# Patient Record
Sex: Female | Born: 1955 | Race: Black or African American | Hispanic: No | Smoking: Former smoker
Health system: Southern US, Community
[De-identification: ages and names within clinical notes are randomized; demographics above are authoritative.]

## PROBLEM LIST (undated history)

## (undated) DIAGNOSIS — I10 Essential (primary) hypertension: Secondary | ICD-10-CM

## (undated) DIAGNOSIS — E119 Type 2 diabetes mellitus without complications: Secondary | ICD-10-CM

## (undated) DIAGNOSIS — M1990 Unspecified osteoarthritis, unspecified site: Secondary | ICD-10-CM

## (undated) DIAGNOSIS — H269 Unspecified cataract: Secondary | ICD-10-CM

## (undated) DIAGNOSIS — F32A Depression, unspecified: Secondary | ICD-10-CM

## (undated) DIAGNOSIS — K219 Gastro-esophageal reflux disease without esophagitis: Secondary | ICD-10-CM

## (undated) HISTORY — DX: Type 2 diabetes mellitus without complications: E11.9

## (undated) HISTORY — DX: Unspecified cataract: H26.9

## (undated) HISTORY — PX: DILATION AND CURETTAGE, DIAGNOSTIC / THERAPEUTIC: SUR384

## (undated) HISTORY — PX: COLONOSCOPY: SHX174

## (undated) HISTORY — PX: BREAST BIOPSY: SHX20

## (undated) HISTORY — DX: Essential (primary) hypertension: I10

## (undated) HISTORY — PX: UPPER GASTROINTESTINAL ENDOSCOPY: SHX188

## (undated) HISTORY — DX: Depression, unspecified: F32.A

## (undated) HISTORY — DX: Gastro-esophageal reflux disease without esophagitis: K21.9

---

## 2003-02-14 HISTORY — PX: CHOLECYSTECTOMY: SHX55

## 2017-10-10 ENCOUNTER — Other Ambulatory Visit: Payer: Self-pay | Admitting: Family Medicine

## 2017-10-10 DIAGNOSIS — M7989 Other specified soft tissue disorders: Secondary | ICD-10-CM

## 2017-10-11 ENCOUNTER — Ambulatory Visit
Admission: RE | Admit: 2017-10-11 | Discharge: 2017-10-11 | Disposition: A | Discharge status: Home / Self Care | Payer: Federal, State, Local not specified - PPO | Source: Ambulatory Visit | Attending: Family Medicine | Admitting: Family Medicine

## 2017-10-11 DIAGNOSIS — M7989 Other specified soft tissue disorders: Secondary | ICD-10-CM

## 2018-03-28 ENCOUNTER — Ambulatory Visit
Admission: RE | Admit: 2018-03-28 | Discharge: 2018-03-28 | Disposition: A | Discharge status: Home / Self Care | Payer: Federal, State, Local not specified - PPO | Source: Ambulatory Visit | Attending: Family Medicine | Admitting: Family Medicine

## 2018-03-28 ENCOUNTER — Other Ambulatory Visit: Payer: Self-pay | Admitting: Family Medicine

## 2018-03-28 DIAGNOSIS — M25551 Pain in right hip: Secondary | ICD-10-CM

## 2018-05-22 ENCOUNTER — Ambulatory Visit (INDEPENDENT_AMBULATORY_CARE_PROVIDER_SITE_OTHER): Payer: Federal, State, Local not specified - PPO | Admitting: Gastroenterology

## 2018-05-22 ENCOUNTER — Other Ambulatory Visit: Payer: Self-pay

## 2018-05-22 ENCOUNTER — Encounter: Payer: Self-pay | Admitting: Gastroenterology

## 2018-05-22 DIAGNOSIS — E739 Lactose intolerance, unspecified: Secondary | ICD-10-CM | POA: Diagnosis not present

## 2018-05-22 DIAGNOSIS — R197 Diarrhea, unspecified: Secondary | ICD-10-CM | POA: Diagnosis not present

## 2018-05-22 DIAGNOSIS — R1012 Left upper quadrant pain: Secondary | ICD-10-CM | POA: Diagnosis not present

## 2018-05-22 DIAGNOSIS — Z800 Family history of malignant neoplasm of digestive organs: Secondary | ICD-10-CM

## 2018-05-22 NOTE — Progress Notes (Signed)
Deborah Casey    [id number]    [birth year]  Primary Care Physician:Reese, Zadie Cleverly, MD  Referring Physician: Lin Landsman, [place],  [place] [id number]  This service was provided via telemedicine due to [place] 19.  Patient location: Home Provider location: Office Used 2 patient identifiers to confirm the correct person. Explained the limitations in evaluation and management via telemedicine. Patient is aware of potential medical charges for this visit.  Patient consented to this virtual visit (via telephone/webex).  The persons participating in this telemedicine service were myself and the patient    Chief complaint: Diarrhea  HPI: 63 year old female recently moved from [place] with complaints of intermittent diarrhea since December 2019. Her primary care physician prescribed Viberzi but her out-of-pocket expense was higher and she never started it.  Diarrhea intermittent episodes, mostly worse when she eats a large breakfast.  She also recalls having it when she had [facility] with cheese and sour cream.  On most days she has 1 soft bowel movement daily Ibuprofen occasionally but denies frequent NSAID use  Colonoscopy in [place], Sep 2016 with small polyp removal.  Prior to that she had colonoscopy in January 2011 and 2013.  Records are not available during this visit to review She is status post cholecystectomy  Complaining of intermittent left upper abdominal discomfort especially when she lays down on the side worse in the past few weeks. Mother had gastric cancer at age 66  First cousin cousin had breast cancer in her 6's  Sister had cancer in appendix  Never had EGD.  Eyes any dysphagia, odynophagia, melena, bright red blood per rectum, loss of appetite or weight loss     Outpatient Encounter Medications as of 05/22/2018  Medication Sig  . Eluxadoline (VIBERZI) 75 MG TABS Take 1 tablet by mouth once.  Marland Kitchen ibuprofen  (ADVIL,MOTRIN) 800 MG tablet Take 800 mg by mouth every 8 (eight) hours as needed.  Marland Kitchen lisinopril-hydrochlorothiazide (PRINZIDE,ZESTORETIC) 20-12.5 MG tablet Take 1 tablet by mouth daily.  . sitaGLIPtin-metformin (JANUMET) 50-1000 MG tablet Take 1 tablet by mouth 2 (two) times daily with a meal.   No facility-administered encounter medications on file as of 05/22/2018.     Allergies as of 05/22/2018  . (No Known Allergies)    Past Medical History:  Diagnosis Date  . DM (diabetes mellitus), type 2 ([place])   . Hypertension     Past Surgical History:  Procedure Laterality Date  . CHOLECYSTECTOMY  2005  . COLONOSCOPY  02/2009, 1,2013,1,2016    Family History  Problem Relation Age of Onset  . Stomach cancer Mother   . Heart disease Father   . Hypertension Sister   . Hypertension Brother   . Diabetes Brother     Social History   Socioeconomic History  . Marital status: Divorced    Spouse name: Not on file  . Number of children: 2  . Years of education: Not on file  . Highest education level: Not on file  Occupational History  . Occupation: retired  Scientific laboratory technician  . Financial resource strain: Not on file  . Food insecurity:    Worry: Not on file    Inability: Not on file  . Transportation needs:    Medical: Not on file    Non-medical: Not on file  Tobacco Use  . Smoking status: Never Smoker  . Smokeless tobacco: Never Used  Substance and Sexual Activity  . Alcohol use:  Never    Frequency: Never  . Drug use: Never  . Sexual activity: Not on file  Lifestyle  . Physical activity:    Days per week: Not on file    Minutes per session: Not on file  . Stress: Not on file  Relationships  . Social connections:    Talks on phone: Not on file    Gets together: Not on file    Attends religious service: Not on file    Active member of club or organization: Not on file    Attends meetings of clubs or organizations: Not on file    Relationship status: Not on file  . Intimate  partner violence:    Fear of current or ex partner: Not on file    Emotionally abused: Not on file    Physically abused: Not on file    Forced sexual activity: Not on file  Other Topics Concern  . Not on file  Social History Narrative  . Not on file      Review of systems: Review of Systems as per HPI All other systems reviewed and are negative.   Physical Exam: Vitals were not taken and physical exam was not performed during this virtual visit.  Data Reviewed:  Reviewed labs, radiology imaging, old records and pertinent past GI work up   Assessment and Plan/Recommendations:  63 year old female with history of hypertension, diabetes with intermittent diarrhea and left upper quadrant discomfort  Intermittent diarrhea and bloating likely secondary to lactose intolerance We will do a trial of lactose-free diet  Left upper quadrant discomfort and family history of gastric cancer  H. pylori stool antigen  We will schedule for EGD to evaluate, have to delay the procedure for few weeks due to COVID-19 The risks and benefits as well as alternatives of endoscopic procedure(s) have been discussed and reviewed. All questions answered. The patient agrees to proceed.  Due for surveillance colonoscopy 2021 due to family history of colon cancer     K. Denzil Magnuson , MD   CC: Lin Landsman, MD

## 2018-05-22 NOTE — Patient Instructions (Addendum)
Lactose free diet  Use Lact aid tablets (Over the counter) as needed if planning to eat out or any milk/dairy products   Schedule for EGD , will have to delay the procedure for 2 months due to COVID-19  Please send Handout for EGD   H.pylori stool Ag  Thank you for choosing me and [facility].  Dr. Silverio Decamp

## 2018-06-24 ENCOUNTER — Telehealth: Payer: Self-pay | Admitting: *Deleted

## 2018-06-24 DIAGNOSIS — R1012 Left upper quadrant pain: Secondary | ICD-10-CM

## 2018-06-24 DIAGNOSIS — Z800 Family history of malignant neoplasm of digestive organs: Secondary | ICD-10-CM

## 2018-06-24 NOTE — Telephone Encounter (Signed)
Called patient to schedule her EGD for the 19th but patient said she was suppose to have Hpylori test done first,  So patient will come to the lab tomorrow to get test kit and turn back in. I told her we would schedule EGD after we receive her results    Dr Darleen Crocker

## 2018-06-24 NOTE — Telephone Encounter (Signed)
Called patient and told her she did not need to come pick up H pylori testing kit per Dr Jillyn Hidden recommendations   Patient agreed to proceed with the EGD on 5/19  Will mail out instructions today for patient also sent out new activation code for My Chart for patient   Patient aware that care partner has to stay outside

## 2018-06-24 NOTE — Telephone Encounter (Signed)
If she hasnt done the stool test yet, will can cancel it. I will take biopsies during EGD and can proceed with EGD directly if patient is able to do it, if not proceed with H.pylori stool Ag test

## 2018-06-30 ENCOUNTER — Telehealth: Payer: Self-pay | Admitting: *Deleted

## 2018-06-30 NOTE — Telephone Encounter (Signed)
Attempted covid screen. Left VM

## 2018-07-01 ENCOUNTER — Telehealth: Payer: Self-pay | Admitting: *Deleted

## 2018-07-01 NOTE — Telephone Encounter (Signed)
Covid-19 travel screening questions  Have you traveled in the last 14 days?no If yes where?  Do you now or have you had a fever in the last 14 days?no  Do you have any respiratory symptoms of shortness of breath or cough now or in the last 14 days?no  Do you have a medical history of Congestive Heart Failure?  Do you have a medical history of lung disease?  Do you have any family members or close contacts with diagnosed or suspected Covid-19?no  Pt aware of care partner policy and will bring a mask with her. SM

## 2018-07-02 ENCOUNTER — Other Ambulatory Visit: Payer: Self-pay

## 2018-07-02 ENCOUNTER — Encounter: Payer: Self-pay | Admitting: Gastroenterology

## 2018-07-02 ENCOUNTER — Encounter: Payer: Federal, State, Local not specified - PPO | Admitting: Gastroenterology

## 2018-07-02 ENCOUNTER — Ambulatory Visit (AMBULATORY_SURGERY_CENTER): Payer: Federal, State, Local not specified - PPO | Admitting: Gastroenterology

## 2018-07-02 VITALS — BP 101/59 | HR 58 | Temp 98.0°F | Resp 11 | Ht 65.0 in

## 2018-07-02 DIAGNOSIS — K2970 Gastritis, unspecified, without bleeding: Secondary | ICD-10-CM

## 2018-07-02 DIAGNOSIS — R1012 Left upper quadrant pain: Secondary | ICD-10-CM

## 2018-07-02 DIAGNOSIS — K3189 Other diseases of stomach and duodenum: Secondary | ICD-10-CM | POA: Diagnosis not present

## 2018-07-02 MED ORDER — SODIUM CHLORIDE 0.9 % IV SOLN: 500.0000 mL | Freq: Once | INTRAVENOUS | Status: DC

## 2018-07-02 NOTE — Patient Instructions (Signed)
Handout given for gastritis.  YOU HAD AN ENDOSCOPIC PROCEDURE TODAY AT [facility]:   Refer to the procedure report that was given to you for any specific questions about what was found during the examination.  If the procedure report does not answer your questions, please call your gastroenterologist to clarify.  If you requested that your care partner not be given the details of your procedure findings, then the procedure report has been included in a sealed envelope for you to review at your convenience later.  YOU SHOULD EXPECT: Some feelings of bloating in the abdomen. Passage of more gas than usual.  Walking can help get rid of the air that was put into your GI tract during the procedure and reduce the bloating. If you had a lower endoscopy (such as a colonoscopy or flexible sigmoidoscopy) you may notice spotting of blood in your stool or on the toilet paper. If you underwent a bowel prep for your procedure, you may not have a normal bowel movement for a few days.  Please Note:  You might notice some irritation and congestion in your nose or some drainage.  This is from the oxygen used during your procedure.  There is no need for concern and it should clear up in a day or so.  SYMPTOMS TO REPORT IMMEDIATELY:    Following upper endoscopy (EGD)  Vomiting of blood or coffee ground material  New chest pain or pain under the shoulder blades  Painful or persistently difficult swallowing  New shortness of breath  Fever of 100F or higher  Black, tarry-looking stools  For urgent or emergent issues, a gastroenterologist can be reached at any hour by calling [phone].   DIET:  We do recommend a small meal at first, but then you may proceed to your regular diet.  Drink plenty of fluids but you should avoid alcoholic beverages for 24 hours.  ACTIVITY:  You should plan to take it easy for the rest of today and you should NOT DRIVE or use heavy machinery until tomorrow  (because of the sedation medicines used during the test).    FOLLOW UP: Our staff will call the number listed on your records 48-72 hours following your procedure to check on you and address any questions or concerns that you may have regarding the information given to you following your procedure. If we do not reach you, we will leave a message.  We will attempt to reach you two times.  During this call, we will ask if you have developed any symptoms of COVID 19. If you develop any symptoms (for example fever, flu-like symptoms, shortness of breath, cough etc.) before then, please call [phone].  If any biopsies were taken you will be contacted by phone or by letter within the next 1-3 weeks.  Please call us at [phone] if you have not heard about the biopsies in 3 weeks.    SIGNATURES/CONFIDENTIALITY: You and/or your care partner have signed paperwork which will be entered into your electronic medical record.  These signatures attest to the fact that that the information above on your After Visit Summary has been reviewed and is understood.  Full responsibility of the confidentiality of this discharge information lies with you and/or your care-partner.

## 2018-07-02 NOTE — Op Note (Signed)
[facility] Patient Name: Deborah Casey Procedure Date: 07/02/2018 10:39 AM MRN: [id number] Endoscopist: Mauri Pole , MD Age: 63 Referring MD:  Date of Birth: 06-14-1955 Gender: Female Account #: [id number] Procedure:                Upper GI endoscopy Indications:              Abdominal pain in the left upper quadrant Medicines:                Monitored Anesthesia Care Procedure:                Pre-Anesthesia Assessment:                           - Prior to the procedure, a History and Physical                            was performed, and patient medications and                            allergies were reviewed. The patient's tolerance of                            previous anesthesia was also reviewed. The risks                            and benefits of the procedure and the sedation                            options and risks were discussed with the patient.                            All questions were answered, and informed consent                            was obtained. Prior Anticoagulants: The patient has                            taken no previous anticoagulant or antiplatelet                            agents. ASA Grade Assessment: II - A patient with                            mild systemic disease. After reviewing the risks                            and benefits, the patient was deemed in                            satisfactory condition to undergo the procedure.                           After obtaining informed consent, the endoscope was  passed under direct vision. Throughout the                            procedure, the patient's blood pressure, pulse, and                            oxygen saturations were monitored continuously. The                            Endoscope was introduced through the mouth, and                            advanced to the second part of duodenum. The upper                            GI  endoscopy was accomplished without difficulty.                            The patient tolerated the procedure well. Scope In: Scope Out: Findings:                 The Z-line was regular and was found 35 cm from the                            incisors.                           Patchy mild inflammation characterized by                            congestion (edema), erosions and erythema was found                            in the gastric antrum, in the prepyloric region of                            the stomach and at the pylorus. Biopsies were taken                            with a cold forceps for Helicobacter pylori testing.                           The examined duodenum was normal. Complications:            No immediate complications. Estimated Blood Loss:     Estimated blood loss was minimal. Impression:               - Z-line regular, 35 cm from the incisors.                           - Gastritis. Biopsied.                           - Normal examined duodenum. Recommendation:           - Patient has a contact number available  for                            emergencies. The signs and symptoms of potential                            delayed complications were discussed with the                            patient. Return to normal activities tomorrow.                            Written discharge instructions were provided to the                            patient.                           - Resume previous diet.                           - Continue present medications.                           - Await pathology results. Mauri Pole, MD 07/02/2018 10:57:38 AM This report has been signed electronically.

## 2018-07-02 NOTE — Progress Notes (Signed)
PT taken to PACU. Monitors in place. VSS. Report given to RN. 

## 2018-07-02 NOTE — Progress Notes (Signed)
Called to room to assist during endoscopic procedure.  Patient ID and intended procedure confirmed with present staff. Received instructions for my participation in the procedure from the performing physician.  

## 2018-07-04 ENCOUNTER — Telehealth: Payer: Self-pay

## 2018-07-04 ENCOUNTER — Telehealth: Payer: Self-pay | Admitting: *Deleted

## 2018-07-04 NOTE — Telephone Encounter (Signed)
1. Have you developed a fever since your procedure? no  2.   Have you had an respiratory symptoms (SOB or cough) since your procedure? no  3.   Have you tested positive for COVID 19 since your procedure no  3.   Have you had any family members/close contacts diagnosed with the COVID 19 since your procedure?  no   If any of these questions are a yes, please inquire if patient has been seen by family doctor and route this note to Joylene John, Therapist, sports.    Follow up Call-  Call back number 07/02/2018  Post procedure Call Back phone  # [phone]  Permission to leave phone message Yes     Patient questions:  Do you have a fever, pain , or abdominal swelling? No. Pain Score  0 *  Have you tolerated food without any problems? Yes.    Have you been able to return to your normal activities? Yes.    Do you have any questions about your discharge instructions: Diet   No. Medications  No. Follow up visit  No.  Do you have questions or concerns about your Care? No.  Actions: * If pain score is 4 or above: No action needed, pain <4.

## 2018-07-04 NOTE — Telephone Encounter (Signed)
  Follow up Call-  Call back number 07/02/2018  Post procedure Call Back phone  # [phone]  Permission to leave phone message Yes     Left message

## 2018-07-09 ENCOUNTER — Encounter: Payer: Self-pay | Admitting: Gastroenterology

## 2018-07-20 ENCOUNTER — Other Ambulatory Visit: Payer: Self-pay | Admitting: Family Medicine

## 2018-07-20 DIAGNOSIS — Z20822 Contact with and (suspected) exposure to covid-19: Secondary | ICD-10-CM

## 2018-07-22 LAB — NOVEL CORONAVIRUS, NAA: SARS-CoV-2, NAA: NOT DETECTED

## 2019-07-25 ENCOUNTER — Encounter: Payer: Self-pay | Admitting: Gastroenterology

## 2019-09-12 ENCOUNTER — Telehealth: Payer: Self-pay | Admitting: Gastroenterology

## 2019-09-12 NOTE — Telephone Encounter (Signed)
Pt is requesting a call back from a nurse in regards to her prep

## 2019-09-12 NOTE — Telephone Encounter (Signed)
Left message on machine to call back  

## 2019-09-23 ENCOUNTER — Other Ambulatory Visit: Payer: Self-pay

## 2019-09-23 ENCOUNTER — Ambulatory Visit (AMBULATORY_SURGERY_CENTER): Payer: Self-pay

## 2019-09-23 VITALS — Ht 63.0 in | Wt 221.6 lb

## 2019-09-23 DIAGNOSIS — Z800 Family history of malignant neoplasm of digestive organs: Secondary | ICD-10-CM

## 2019-09-23 DIAGNOSIS — R1012 Left upper quadrant pain: Secondary | ICD-10-CM

## 2019-09-23 MED ORDER — SUTAB 1479-225-188 MG PO TABS: 12.0000 | ORAL_TABLET | ORAL | 0 refills | Status: DC

## 2019-09-23 NOTE — Progress Notes (Signed)
No allergies to soy or egg Pt is not on blood thinners or diet pills Denies issues with sedation/intubation Denies atrial flutter/fib Denies constipation   Emmi instructions given to pt  Pt is aware of Covid safety and care partner requirements.  

## 2019-10-01 ENCOUNTER — Telehealth: Payer: Self-pay | Admitting: Gastroenterology

## 2019-10-01 NOTE — Telephone Encounter (Signed)
Answered all of patient's prep questions at this time. Pt understands to take BP med morning of procedure but not the Janumet diabetic medication.

## 2019-10-07 ENCOUNTER — Encounter: Payer: Self-pay | Admitting: Gastroenterology

## 2019-10-07 ENCOUNTER — Other Ambulatory Visit: Payer: Self-pay

## 2019-10-07 ENCOUNTER — Ambulatory Visit (AMBULATORY_SURGERY_CENTER): Payer: Medicare (Managed Care) | Admitting: Gastroenterology

## 2019-10-07 VITALS — BP 103/45 | HR 66 | Temp 96.9°F | Resp 16 | Ht 63.0 in | Wt 221.0 lb

## 2019-10-07 DIAGNOSIS — D122 Benign neoplasm of ascending colon: Secondary | ICD-10-CM

## 2019-10-07 DIAGNOSIS — Z86010 Personal history of colonic polyps: Secondary | ICD-10-CM

## 2019-10-07 DIAGNOSIS — Z800 Family history of malignant neoplasm of digestive organs: Secondary | ICD-10-CM | POA: Diagnosis not present

## 2019-10-07 MED ORDER — SODIUM CHLORIDE 0.9 % IV SOLN: 500.0000 mL | Freq: Once | INTRAVENOUS | Status: DC

## 2019-10-07 NOTE — Op Note (Signed)
[facility] Patient Name: Deborah Casey Procedure Date: 10/07/2019 3:19 PM MRN: [id number] Endoscopist: Mauri Pole , MD Age: 64 Referring MD:  Date of Birth: 1955-03-13 Gender: Female Account #: [id number] Procedure:                Colonoscopy Indications:              Screening in patient at increased risk: Family                            history of 1st-degree relative with colorectal                            cancer, High risk colon cancer surveillance:                            Personal history of colonic polyps, High risk colon                            cancer surveillance: Personal history of adenoma                            less than 10 mm in size Medicines:                Monitored Anesthesia Care Procedure:                Pre-Anesthesia Assessment:                           - Prior to the procedure, a History and Physical                            was performed, and patient medications and                            allergies were reviewed. The patient's tolerance of                            previous anesthesia was also reviewed. The risks                            and benefits of the procedure and the sedation                            options and risks were discussed with the patient.                            All questions were answered, and informed consent                            was obtained. Prior Anticoagulants: The patient has                            taken no previous anticoagulant or antiplatelet  agents. ASA Grade Assessment: III - A patient with                            severe systemic disease. After reviewing the risks                            and benefits, the patient was deemed in                            satisfactory condition to undergo the procedure.                           After obtaining informed consent, the colonoscope                            was passed under direct vision.  Throughout the                            procedure, the patient's blood pressure, pulse, and                            oxygen saturations were monitored continuously. The                            Colonoscope was introduced through the anus and                            advanced to the the cecum, identified by                            appendiceal orifice and ileocecal valve. The                            colonoscopy was performed without difficulty. The                            patient tolerated the procedure well. The quality                            of the bowel preparation was excellent. The                            ileocecal valve, appendiceal orifice, and rectum                            were photographed. Scope In: 3:32:38 PM Scope Out: 3:49:14 PM Scope Withdrawal Time: 0 hours 9 minutes 43 seconds  Total Procedure Duration: 0 hours 16 minutes 36 seconds  Findings:                 The perianal and digital rectal examinations were                            normal.  A less than 1 mm polyp was found in the ascending                            colon. The polyp was sessile. The polyp was removed                            with a cold biopsy forceps. Resection and retrieval                            were complete.                           Non-bleeding internal hemorrhoids were found during                            retroflexion. The hemorrhoids were medium-sized. Complications:            No immediate complications. Estimated Blood Loss:     Estimated blood loss was minimal. Impression:               - One less than 1 mm polyp in the ascending colon,                            removed with a cold biopsy forceps. Resected and                            retrieved.                           - Non-bleeding internal hemorrhoids. Recommendation:           - Patient has a contact number available for                            emergencies. The signs  and symptoms of potential                            delayed complications were discussed with the                            patient. Return to normal activities tomorrow.                            Written discharge instructions were provided to the                            patient.                           - Resume previous diet.                           - Continue present medications.                           - Await pathology results.                           -  Repeat colonoscopy in 5 years for surveillance                            based on pathology results. Mauri Pole, MD 10/07/2019 4:06:06 PM This report has been signed electronically.

## 2019-10-07 NOTE — Progress Notes (Signed)
robinol antisialogogue  Lidocaine   buffer 

## 2019-10-07 NOTE — Progress Notes (Signed)
Called to room to assist during endoscopic procedure.  Patient ID and intended procedure confirmed with present staff. Received instructions for my participation in the procedure from the performing physician.  

## 2019-10-07 NOTE — Patient Instructions (Signed)
Handouts given for Polyps and Hemorrhoids.   YOU HAD AN ENDOSCOPIC PROCEDURE TODAY AT [facility]:   Refer to the procedure report that was given to you for any specific questions about what was found during the examination.  If the procedure report does not answer your questions, please call your gastroenterologist to clarify.  If you requested that your care partner not be given the details of your procedure findings, then the procedure report has been included in a sealed envelope for you to review at your convenience later.  YOU SHOULD EXPECT: Some feelings of bloating in the abdomen. Passage of more gas than usual.  Walking can help get rid of the air that was put into your GI tract during the procedure and reduce the bloating. If you had a lower endoscopy (such as a colonoscopy or flexible sigmoidoscopy) you may notice spotting of blood in your stool or on the toilet paper. If you underwent a bowel prep for your procedure, you may not have a normal bowel movement for a few days.  Please Note:  You might notice some irritation and congestion in your nose or some drainage.  This is from the oxygen used during your procedure.  There is no need for concern and it should clear up in a day or so.  SYMPTOMS TO REPORT IMMEDIATELY:   Following lower endoscopy (colonoscopy or flexible sigmoidoscopy):  Excessive amounts of blood in the stool  Significant tenderness or worsening of abdominal pains  Swelling of the abdomen that is new, acute  Fever of 100F or higher  For urgent or emergent issues, a gastroenterologist can be reached at any hour by calling [phone]. Do not use MyChart messaging for urgent concerns.    DIET:  We do recommend a small meal at first, but then you may proceed to your regular diet.  Drink plenty of fluids but you should avoid alcoholic beverages for 24 hours.  ACTIVITY:  You should plan to take it easy for the rest of today and you should NOT DRIVE  or use heavy machinery until tomorrow (because of the sedation medicines used during the test).    FOLLOW UP: Our staff will call the number listed on your records 48-72 hours following your procedure to check on you and address any questions or concerns that you may have regarding the information given to you following your procedure. If we do not reach you, we will leave a message.  We will attempt to reach you two times.  During this call, we will ask if you have developed any symptoms of COVID 19. If you develop any symptoms (ie: fever, flu-like symptoms, shortness of breath, cough etc.) before then, please call [phone].  If you test positive for Covid 19 in the 2 weeks post procedure, please call and report this information to [place].    If any biopsies were taken you will be contacted by phone or by letter within the next 1-3 weeks.  Please call us at [phone] if you have not heard about the biopsies in 3 weeks.    SIGNATURES/CONFIDENTIALITY: You and/or your care partner have signed paperwork which will be entered into your electronic medical record.  These signatures attest to the fact that that the information above on your After Visit Summary has been reviewed and is understood.  Full responsibility of the confidentiality of this discharge information lies with you and/or your care-partner.

## 2019-10-07 NOTE — Progress Notes (Signed)
A and O x3. Report to RN. Tolerated MAC anesthesia well.

## 2019-10-07 NOTE — Progress Notes (Signed)
Pt's states no medical or surgical changes since previsit or office visit. 

## 2019-10-09 NOTE — Telephone Encounter (Signed)
°  Follow up Call-  Call back number 10/07/2019 07/02/2018  Post procedure Call Back phone  # [phone]  Permission to leave phone message Yes Yes     Patient questions:  Do you have a fever, pain , or abdominal swelling? No. Pain Score  0 *  Have you tolerated food without any problems? Yes.    Have you been able to return to your normal activities? Yes.    Do you have any questions about your discharge instructions: Diet   No. Medications  No. Follow up visit  No.  Do you have questions or concerns about your Care? No.  Actions: * If pain score is 4 or above: No action needed, pain <4.  1. Have you developed a fever since your procedure? no  2.   Have you had an respiratory symptoms (SOB or cough) since your procedure? no  3.   Have you tested positive for COVID 19 since your procedure no  4.   Have you had any family members/close contacts diagnosed with the COVID 19 since your procedure?  no   If yes to any of these questions please route to Joylene John, RN and Joella Prince, RN

## 2019-10-10 ENCOUNTER — Encounter: Payer: Self-pay | Admitting: Gastroenterology

## 2020-02-03 ENCOUNTER — Other Ambulatory Visit: Payer: Self-pay | Admitting: Physical Medicine and Rehabilitation

## 2020-02-03 DIAGNOSIS — M1611 Unilateral primary osteoarthritis, right hip: Secondary | ICD-10-CM

## 2020-02-09 ENCOUNTER — Other Ambulatory Visit: Payer: Federal, State, Local not specified - PPO

## 2020-02-12 ENCOUNTER — Ambulatory Visit
Admission: RE | Admit: 2020-02-12 | Discharge: 2020-02-12 | Disposition: A | Discharge status: Home / Self Care | Payer: Federal, State, Local not specified - PPO | Source: Ambulatory Visit | Attending: Physical Medicine and Rehabilitation | Admitting: Physical Medicine and Rehabilitation

## 2020-02-12 ENCOUNTER — Other Ambulatory Visit: Payer: Self-pay

## 2020-02-12 DIAGNOSIS — M1611 Unilateral primary osteoarthritis, right hip: Secondary | ICD-10-CM

## 2020-02-12 MED ORDER — IOPAMIDOL (ISOVUE-M 200) INJECTION 41%
1.0000 mL | Freq: Once | INTRAMUSCULAR | Status: AC
  Administered 2020-02-12: 1 mL via INTRA_ARTICULAR

## 2020-02-12 MED ORDER — METHYLPREDNISOLONE ACETATE 40 MG/ML INJ SUSP (RADIOLOG
120.0000 mg | Freq: Once | INTRAMUSCULAR | Status: AC
  Administered 2020-02-12: 120 mg via INTRA_ARTICULAR

## 2020-08-06 ENCOUNTER — Encounter: Payer: Self-pay | Admitting: Neurology

## 2020-08-31 ENCOUNTER — Other Ambulatory Visit: Payer: Self-pay | Admitting: Orthopedic Surgery

## 2020-08-31 DIAGNOSIS — Z01818 Encounter for other preprocedural examination: Secondary | ICD-10-CM

## 2020-09-10 ENCOUNTER — Ambulatory Visit (INDEPENDENT_AMBULATORY_CARE_PROVIDER_SITE_OTHER): Payer: Medicare (Managed Care) | Admitting: Neurology

## 2020-09-10 ENCOUNTER — Encounter: Payer: Self-pay | Admitting: Neurology

## 2020-09-10 ENCOUNTER — Other Ambulatory Visit: Payer: Self-pay

## 2020-09-10 VITALS — BP 107/71 | HR 86 | Ht 63.0 in | Wt 213.0 lb

## 2020-09-10 DIAGNOSIS — G2581 Restless legs syndrome: Secondary | ICD-10-CM | POA: Diagnosis not present

## 2020-09-10 NOTE — Progress Notes (Signed)
[facility] Clinic Note - Initial Visit   Date: 09/10/20  Quanah Slinger MRN: [id number] DOB: [birth year]   Dear Dr. Ayesha Rumpf:  Thank you for your kind referral of [place] for consultation of feet paresthesias. Although her history is well known to you, please allow [place] to reiterate it for the purpose of our medical record. The patient was accompanied to the clinic by self.   History of Present Illness: Deborah Casey is a 65 y.o. right-handed female with diabetes mellitus, GERD, hypertension, and depression presenting for evaluation of bilateral feet tingling.  Starting in early 2022, she began having intermittent pins and needles in the feet.  She was evaluated at [facility] and had normal NCS/EMG of the legs.  She was started on gabapentin '100mg'$  at bedtime.  Symptoms have significantly improved and occurs about once per week, lasting about a minute.  She continues to have nights of wanting to rub her feet together to get relief.  She does not have to walk.    Out-side paper records, electronic medical record, and images have been reviewed where available and summarized as:  NCS/EMG of the legs 06/01/2020 [facility]:  Normal  Past Medical History:  Diagnosis Date   Cataract    bilateral but not ready for surgery   Depression    DM (diabetes mellitus), type 2 ([place])    GERD (gastroesophageal reflux disease)    Hypertension     Past Surgical History:  Procedure Laterality Date   CHOLECYSTECTOMY  2005   COLONOSCOPY  02/2009, 1,2013,1,2016   DILATION AND CURETTAGE, DIAGNOSTIC / THERAPEUTIC     UPPER GASTROINTESTINAL ENDOSCOPY     2020     Medications:  Outpatient Encounter Medications as of 09/10/2020  Medication Sig   acetaminophen (TYLENOL) 650 MG CR tablet Take 650 mg by mouth every 8 (eight) hours as needed for pain.   ALPRAZolam (XANAX) 0.5 MG tablet alprazolam 0.5 mg tablet   Ascorbic Acid (VITAMIN C) 100 MG CHEW Vitamin C    aspirin 325 MG tablet Take 325 mg by mouth daily.   Cholecalciferol (D3-1000) 25 MCG (1000 UT) tablet Take 1,000 Units by mouth daily.   cyanocobalamin 1000 MCG tablet Take 1,000 mcg by mouth daily.   doxylamine, Sleep, (UNISOM) 25 MG tablet Take 25 mg by mouth at bedtime as needed.   gabapentin (NEURONTIN) 100 MG capsule Take 100 mg by mouth 3 (three) times daily.   lisinopril-hydrochlorothiazide (PRINZIDE,ZESTORETIC) 20-12.5 MG tablet Take 1 tablet by mouth daily.   Omega-3 Fatty Acids (FISH OIL) 1000 MG CAPS Fish Oil   omeprazole (PRILOSEC) 40 MG capsule Take 40 mg by mouth daily.   sitaGLIPtin-metformin (JANUMET) 50-1000 MG tablet Take 1 tablet by mouth 2 (two) times daily at 10 AM and 5 PM. Take 2 tablets in the evening   Eluxadoline (VIBERZI) 75 MG TABS Take 1 tablet by mouth once. (Patient not taking: No sig reported)   glucose blood test strip FreeStyle Lite Strips (Patient not taking: Reported on 09/10/2020)   hydrOXYzine (ATARAX/VISTARIL) 25 MG tablet Take 25 mg by mouth 4 (four) times daily. (Patient not taking: Reported on 09/10/2020)   ibuprofen (ADVIL,MOTRIN) 800 MG tablet Take 800 mg by mouth every 8 (eight) hours as needed. (Patient not taking: No sig reported)   SitaGLIPtin-MetFORMIN HCl (JANUMET XR) 50-500 MG TB24  (Patient not taking: Reported on 09/10/2020)   No facility-administered encounter medications on file as of 09/10/2020.    Allergies:  Allergies  Allergen Reactions   Ciprofloxacin     Family History: Family History  Problem Relation Age of Onset   Stomach cancer Mother 45   Heart disease Father    Tuberculosis Father    Hypertension Sister    Cancer Sister        appendix   Hypertension Brother    Diabetes Brother    Breast cancer Cousin        20's   Colon cancer Neg Hx    Colon polyps Neg Hx    Esophageal cancer Neg Hx    Rectal cancer Neg Hx     Social History: Social History   Tobacco Use   Smoking status: Former   Smokeless tobacco:  Never  Scientific laboratory technician Use: Never used  Substance Use Topics   Alcohol use: Yes    Comment: rarely   Drug use: Never   Social History   Social History Narrative   Right Handed    Lives in a one story apartment    Drinks Caffeine     Vital Signs:  BP 107/71   Pulse 86   Ht '5\' 3"'$  (1.6 m)   Wt 213 lb (96.6 kg)   SpO2 97%   BMI 37.73 kg/m    Neurological Exam: MENTAL STATUS including orientation to time, place, person, recent and remote memory, attention span and concentration, language, and fund of knowledge is normal.  Speech is not dysarthric.  CRANIAL NERVES: II:  No visual field defects.  Unremarkable fundi.   III-IV-VI: Pupils equal round and reactive to light.  Normal conjugate, extra-ocular eye movements in all directions of gaze.  No nystagmus.  No ptosis.   V:  Normal facial sensation.    VII:  Normal facial symmetry and movements.   VIII:  Normal hearing and vestibular function.   IX-X:  Normal palatal movement.   XI:  Normal shoulder shrug and head rotation.   XII:  Normal tongue strength and range of motion, no deviation or fasciculation.  MOTOR:  No atrophy, fasciculations or abnormal movements.  No pronator drift.   Upper Extremity:  Right  Left  Deltoid  5/5   5/5   Biceps  5/5   5/5   Triceps  5/5   5/5   Infraspinatus 5/5  5/5  Medial pectoralis 5/5  5/5  Wrist extensors  5/5   5/5   Wrist flexors  5/5   5/5   Finger extensors  5/5   5/5   Finger flexors  5/5   5/5   Dorsal interossei  5/5   5/5   Abductor pollicis  5/5   5/5   Tone (Ashworth scale)  0  0   Lower Extremity:  Right  Left  Hip flexors  5/5   5/5   Hip extensors  5/5   5/5   Adductor 5/5  5/5  Abductor 5/5  5/5  Knee flexors  5/5   5/5   Knee extensors  5/5   5/5   Dorsiflexors  5/5   5/5   Plantarflexors  5/5   5/5   Toe extensors  5/5   5/5   Toe flexors  5/5   5/5   Tone (Ashworth scale)  0  0   MSRs:  Right        Left                  brachioradialis 2+  2+   biceps 2+  2+  triceps 2+  2+  patellar 2+  2+  ankle jerk 2+  2+  Hoffman no  no  plantar response down  down   SENSORY:  Normal and symmetric perception of light touch, pinprick, vibration, and proprioception.  Romberg's sign absent.   COORDINATION/GAIT: Normal finger-to- nose-finger and heel-to-shin.  Intact rapid alternating movements bilaterally.  Able to rise from a chair without using arms.  Gait narrow based and stable. Tandem and stressed gait intact.    IMPRESSION: Restless leg syndrome, mild and intermittent  - She is on a very low dose of gabapentin and given symptoms are improved, she may want to try to see how she does off medication.  I suggest stopping gabapentin and monitoring symptoms; if her RLS gets worse, she may resume taking gabapentin '100mg'$  at bedtime.  - Patient reassured that she does not have neuropathy on her NCS/EMG or by exam.  Return to clinic, if symptoms get worse   Thank you for allowing me to participate in patient's care.  If I can answer any additional questions, I would be pleased to do so.    Sincerely,    Ellan Tess K. Posey Pronto, DO

## 2020-09-10 NOTE — Patient Instructions (Signed)
Try stopping the gabapentin and see how you do.  If your symptoms return, you may restart taking gabapentin '100mg'$  at bedtime.

## 2020-09-20 ENCOUNTER — Other Ambulatory Visit: Payer: Self-pay | Admitting: Internal Medicine

## 2020-09-20 DIAGNOSIS — Z1231 Encounter for screening mammogram for malignant neoplasm of breast: Secondary | ICD-10-CM

## 2020-09-21 NOTE — Progress Notes (Signed)
DUE TO COVID-19 ONLY ONE VISITOR IS ALLOWED TO COME WITH YOU AND STAY IN THE WAITING ROOM ONLY DURING PRE OP AND PROCEDURE DAY OF SURGERY.  2 VISITOR  MAY VISIT WITH YOU AFTER SURGERY IN YOUR PRIVATE ROOM DURING VISITING HOURS ONLY!  YOU NEED TO HAVE A COVID 19 TEST ON__ 09/30/2020 ____'@_'$  '@_from'$  8am-3pm _____, THIS TEST MUST BE DONE BEFORE SURGERY,  Covid test is done at [place], [place] [address].  This is a drive thru.  No appt required. Please see map.                 Your procedure is scheduled on:     10/04/2020   Report to [facility] Main  Entrance   Report to admitting at    0645am AM     Call this number if you have problems the morning of surgery [phone]    REMEMBER: NO  SOLID FOOD CANDY OR GUM AFTER MIDNIGHT. CLEAR LIQUIDS UNTIL   0615am         . NOTHING BY MOUTH EXCEPT CLEAR LIQUIDS UNTIL    0615am   . PLEASE FINISH ENSURE DRINK PER SURGEON ORDER  WHICH NEEDS TO BE COMPLETED AT   0615am    .      CLEAR LIQUID DIET   Foods Allowed                                                                    Coffee and tea, regular and decaf                            Fruit ices (not with fruit pulp)                                      Iced Popsicles                                    Carbonated beverages, regular and diet                                    Cranberry, grape and apple juices Sports drinks like Gatorade Lightly seasoned clear broth or consume(fat free) Sugar, honey syrup ___________________________________________________________________      BRUSH YOUR TEETH MORNING OF SURGERY AND RINSE YOUR MOUTH OUT, NO CHEWING GUM CANDY OR MINTS.     Take these medicines the morning of surgery with A SIP OF WATER: prilosec   DO NOT TAKE ANY DIABETIC MEDICATIONS DAY OF YOUR SURGERY                               You may not have any metal on your body including hair pins and              piercings  Do not wear jewelry, make-up, lotions,  powders or perfumes, deodorant  Do not wear nail polish on your fingernails.  Do not shave  48 hours prior to surgery.              Men may shave face and neck.   Do not bring valuables to the hospital. [place].  Contacts, dentures or bridgework may not be worn into surgery.  Leave suitcase in the car. After surgery it may be brought to your room.     Patients discharged the day of surgery will not be allowed to drive home. IF YOU ARE HAVING SURGERY AND GOING HOME THE SAME DAY, YOU MUST HAVE AN ADULT TO DRIVE YOU HOME AND BE WITH YOU FOR 24 HOURS. YOU MAY GO HOME BY TAXI OR UBER OR ORTHERWISE, BUT AN ADULT MUST ACCOMPANY YOU HOME AND STAY WITH YOU FOR 24 HOURS.  Name and phone number of your driver:  Special Instructions: N/A              Please read over the following fact sheets you were given: _____________________________________________________________________  [facility] - Preparing for Surgery Before surgery, you can play an important role.  Because skin is not sterile, your skin needs to be as free of germs as possible.  You can reduce the number of germs on your skin by washing with CHG (chlorahexidine gluconate) soap before surgery.  CHG is an antiseptic cleaner which kills germs and bonds with the skin to continue killing germs even after washing. Please DO NOT use if you have an allergy to CHG or antibacterial soaps.  If your skin becomes reddened/irritated stop using the CHG and inform your nurse when you arrive at Short Stay. Do not shave (including legs and underarms) for at least 48 hours prior to the first CHG shower.  You may shave your face/neck. Please follow these instructions carefully:  1.  Shower with CHG Soap the night before surgery and the  morning of Surgery.  2.  If you choose to wash your hair, wash your hair first as usual with your  normal  shampoo.  3.  After you shampoo, rinse your hair and body  thoroughly to remove the  shampoo.                           4.  Use CHG as you would any other liquid soap.  You can apply chg directly  to the skin and wash                       Gently with a scrungie or clean washcloth.  5.  Apply the CHG Soap to your body ONLY FROM THE NECK DOWN.   Do not use on face/ open                           Wound or open sores. Avoid contact with eyes, ears mouth and genitals (private parts).                       Wash face,  Genitals (private parts) with your normal soap.             6.  Wash thoroughly, paying special attention to the area where your surgery  will be performed.  7.  Thoroughly rinse your body with  warm water from the neck down.  8.  DO NOT shower/wash with your normal soap after using and rinsing off  the CHG Soap.                9.  Pat yourself dry with a clean towel.            10.  Wear clean pajamas.            11.  Place clean sheets on your bed the night of your first shower and do not  sleep with pets. Day of Surgery : Do not apply any lotions/deodorants the morning of surgery.  Please wear clean clothes to the hospital/surgery center.  FAILURE TO FOLLOW THESE INSTRUCTIONS MAY RESULT IN THE CANCELLATION OF YOUR SURGERY PATIENT SIGNATURE_________________________________  NURSE SIGNATURE__________________________________  ________________________________________________________________________

## 2020-09-23 ENCOUNTER — Encounter (HOSPITAL_COMMUNITY): Payer: Self-pay

## 2020-09-23 ENCOUNTER — Ambulatory Visit (HOSPITAL_COMMUNITY)
Admission: RE | Admit: 2020-09-23 | Discharge: 2020-09-23 | Disposition: A | Discharge status: Home / Self Care | Payer: Medicare (Managed Care) | Source: Ambulatory Visit | Attending: Orthopedic Surgery | Admitting: Orthopedic Surgery

## 2020-09-23 ENCOUNTER — Other Ambulatory Visit: Payer: Self-pay

## 2020-09-23 ENCOUNTER — Encounter (HOSPITAL_COMMUNITY)
Admission: RE | Admit: 2020-09-23 | Discharge: 2020-09-23 | Disposition: A | Discharge status: Home / Self Care | Payer: Medicare (Managed Care) | Source: Ambulatory Visit | Attending: Orthopedic Surgery | Admitting: Orthopedic Surgery

## 2020-09-23 DIAGNOSIS — Z7982 Long term (current) use of aspirin: Secondary | ICD-10-CM | POA: Insufficient documentation

## 2020-09-23 DIAGNOSIS — M1611 Unilateral primary osteoarthritis, right hip: Secondary | ICD-10-CM | POA: Diagnosis not present

## 2020-09-23 DIAGNOSIS — Z79899 Other long term (current) drug therapy: Secondary | ICD-10-CM | POA: Insufficient documentation

## 2020-09-23 DIAGNOSIS — Z01818 Encounter for other preprocedural examination: Secondary | ICD-10-CM | POA: Insufficient documentation

## 2020-09-23 DIAGNOSIS — Z791 Long term (current) use of non-steroidal anti-inflammatories (NSAID): Secondary | ICD-10-CM | POA: Diagnosis not present

## 2020-09-23 DIAGNOSIS — Z7984 Long term (current) use of oral hypoglycemic drugs: Secondary | ICD-10-CM | POA: Insufficient documentation

## 2020-09-23 DIAGNOSIS — E119 Type 2 diabetes mellitus without complications: Secondary | ICD-10-CM | POA: Diagnosis not present

## 2020-09-23 DIAGNOSIS — I10 Essential (primary) hypertension: Secondary | ICD-10-CM | POA: Diagnosis not present

## 2020-09-23 HISTORY — DX: Unspecified osteoarthritis, unspecified site: M19.90

## 2020-09-23 LAB — BASIC METABOLIC PANEL
Anion gap: 11 (ref 5–15)
BUN: 14 mg/dL (ref 8–23)
CO2: 25 mmol/L (ref 22–32)
Calcium: 9.3 mg/dL (ref 8.9–10.3)
Chloride: 108 mmol/L (ref 98–111)
Creatinine, Ser: 0.76 mg/dL (ref 0.44–1.00)
GFR, Estimated: >60 mL/min (ref >60)
Glucose, Bld: 81 mg/dL (ref 70–99)
Potassium: 3.8 mmol/L (ref 3.5–5.1)
Sodium: 144 mmol/L (ref 135–145)

## 2020-09-23 LAB — URINALYSIS, ROUTINE W REFLEX MICROSCOPIC
Bilirubin Urine: NEGATIVE
Glucose, UA: NEGATIVE mg/dL
Hgb urine dipstick: NEGATIVE
Ketones, ur: NEGATIVE mg/dL
Leukocytes,Ua: NEGATIVE
Nitrite: NEGATIVE
Protein, ur: NEGATIVE mg/dL
Specific Gravity, Urine: 1.018 (ref 1.005–1.030)
pH: 5 (ref 5.0–8.0)

## 2020-09-23 LAB — CBC WITH DIFFERENTIAL/PLATELET
Abs Immature Granulocytes: 0.02 10*3/uL (ref 0.00–0.07)
Basophils Absolute: 0 10*3/uL (ref 0.0–0.1)
Basophils Relative: 0 %
Eosinophils Absolute: 0.2 10*3/uL (ref 0.0–0.5)
Eosinophils Relative: 3 %
HCT: 40.2 % (ref 36.0–46.0)
Hemoglobin: 13 g/dL (ref 12.0–15.0)
Immature Granulocytes: 0 %
Lymphocytes Relative: 33 %
Lymphs Abs: 2.3 10*3/uL (ref 0.7–4.0)
MCH: 28.3 pg (ref 26.0–34.0)
MCHC: 32.3 g/dL (ref 30.0–36.0)
MCV: 87.4 fL (ref 80.0–100.0)
Monocytes Absolute: 0.5 10*3/uL (ref 0.1–1.0)
Monocytes Relative: 8 %
Neutro Abs: 3.8 10*3/uL (ref 1.7–7.7)
Neutrophils Relative %: 56 %
Platelets: 289 10*3/uL (ref 150–400)
RBC: 4.6 MIL/uL (ref 3.87–5.11)
RDW: 14.1 % (ref 11.5–15.5)
WBC: 6.9 10*3/uL (ref 4.0–10.5)
nRBC: 0 % (ref 0.0–0.2)

## 2020-09-23 LAB — APTT: aPTT: 33 seconds (ref 24–36)

## 2020-09-23 LAB — HEMOGLOBIN A1C
Hgb A1c MFr Bld: 6.2 % — ABNORMAL HIGH (ref 4.8–5.6)
Mean Plasma Glucose: 131.24 mg/dL

## 2020-09-23 LAB — TYPE AND SCREEN
ABO/RH(D): O POS
Antibody Screen: NEGATIVE

## 2020-09-23 LAB — PROTIME-INR
INR: 1 (ref 0.8–1.2)
Prothrombin Time: 12.9 seconds (ref 11.4–15.2)

## 2020-09-23 LAB — GLUCOSE, CAPILLARY: Glucose-Capillary: 76 mg/dL (ref 70–99)

## 2020-09-23 LAB — SURGICAL PCR SCREEN
MRSA, PCR: NEGATIVE
Staphylococcus aureus: NEGATIVE

## 2020-09-23 NOTE — Progress Notes (Addendum)
Anesthesia Review:  -PCP: Jackson [phone]- LVMM and requested most recent office visit note.   Pt switiching PCP.  Switching to [place]- [phone] Requested [facility] note they are to fax  On chart dated 09/14/2020  Cardiologist : none  Neuro- 09/10/20- DR Posey Pronto- [facility]   Chest x-ray :09/23/20  EKG :09/23/20 Echo : Stress test: Cardiac Cath :  Activity level: can do a flight of stairs without difficulty once hip is fixed  Sleep Study/ CPAP :none  Fasting Blood Sugar :      / Checks Blood Sugar -- times a day:   Blood Thinner/ Instructions /Last Dose: ASA / Instructions/ Last Dose :   DM- type 2  Hgba1c-09/23/20-  Pt does not check glucose at home per pt

## 2020-09-23 NOTE — Progress Notes (Signed)
Glucose was 76 at preop appt.  Water and  peanut crackers given.  PT reports she has not eaten lunch today.  Voices no complaints.

## 2020-09-27 NOTE — Progress Notes (Signed)
Anesthesia Chart Review:   Case: [id number] Date/Time: 10/04/20 0925   Procedure: RIGHT TOTAL HIP ARTHROPLASTY ANTERIOR APPROACH (Right: Hip)   Anesthesia type: Spinal   Pre-op diagnosis: RIGHT HIP OSTEOARTHRITIS   Location: [facility] ROOM 08 / [facility]   Surgeons: Frederik Pear, MD       DISCUSSION: Pt is 65 years old with hx HTN, DM  No CV symptoms reported at presurgical testing; DM well controlled.   VS: BP 117/73   Pulse 84   Temp 36.8 C (Oral)   Resp 16   Ht '5\' 3"'$  (1.6 m)   Wt 93 kg   SpO2 99%   BMI 36.31 kg/m   PROVIDERS: - PCP is Entzminger, Mendel Corning, MD. Last office visit 09/14/20 note on paper chart    LABS: Labs reviewed: Acceptable for surgery. (all labs ordered are listed, but only abnormal results are displayed)  Labs Reviewed  HEMOGLOBIN A1C - Abnormal; Notable for the following components:      Result Value   Hgb A1c MFr Bld 6.2 (*)    All other components within normal limits  SURGICAL PCR SCREEN  CBC WITH DIFFERENTIAL/PLATELET  BASIC METABOLIC PANEL  PROTIME-INR  APTT  URINALYSIS, ROUTINE W REFLEX MICROSCOPIC  GLUCOSE, CAPILLARY  TYPE AND SCREEN     IMAGES: CXR 09/23/20: No active cardiopulmonary disease.  EKG 09/23/20: Normal sinus rhythm Possible Left atrial enlargement Left axis deviation Low voltage QRS Cannot rule out Anterior infarct , age undetermined No prior EKG for comparison   CV: N/A  Past Medical History:  Diagnosis Date   Arthritis    Cataract    bilateral but not ready for surgery   Depression    DM (diabetes mellitus), type 2 ([place])    Hypertension     Past Surgical History:  Procedure Laterality Date   CHOLECYSTECTOMY  2005   COLONOSCOPY  02/2009, 1,2013,1,2016   DILATION AND CURETTAGE, DIAGNOSTIC / THERAPEUTIC     UPPER GASTROINTESTINAL ENDOSCOPY     2020    MEDICATIONS:  acetaminophen (TYLENOL) 650 MG CR tablet   aspirin-sod bicarb-citric acid (ALKA-SELTZER) 325 MG TBEF tablet   cetirizine (ZYRTEC) 10 MG  tablet   Cholecalciferol (D3-1000) 25 MCG (1000 UT) tablet   cyanocobalamin 1000 MCG tablet   doxylamine, Sleep, (UNISOM) 25 MG tablet   Emollient (CERAVE) CREA   gabapentin (NEURONTIN) 100 MG capsule   glucose blood test strip   hydrocortisone 2.5 % cream   ibuprofen (ADVIL,MOTRIN) 800 MG tablet   JANUMET XR 50-1000 MG TB24   lactase (LACTAID) 3000 units tablet   lisinopril-hydrochlorothiazide (PRINZIDE,ZESTORETIC) 20-12.5 MG tablet   Omega-3 Fatty Acids (FISH OIL) 1000 MG CAPS   omeprazole (PRILOSEC) 40 MG capsule   vitamin C (ASCORBIC ACID) 500 MG tablet   No current facility-administered medications for this encounter.    If no changes, I anticipate pt can proceed with surgery as scheduled.   Willeen Cass, PhD, FNP-BC [facility]/Anesthesiology Phone: [phone] 09/27/2020 12:05 PM

## 2020-09-27 NOTE — Anesthesia Preprocedure Evaluation (Addendum)
Anesthesia Evaluation  Patient identified by MRN, date of birth, ID band Patient awake    Reviewed: Allergy & Precautions, NPO status , Patient's Chart, lab work & pertinent test results  Airway Mallampati: II  TM Distance: >3 FB Neck ROM: Full    Dental no notable dental hx. (+) Teeth Intact, Dental Advisory Given   Pulmonary former smoker,    Pulmonary exam normal breath sounds clear to auscultation       Cardiovascular hypertension, Normal cardiovascular exam Rhythm:Regular Rate:Normal     Neuro/Psych PSYCHIATRIC DISORDERS Depression negative neurological ROS     GI/Hepatic negative GI ROS, Neg liver ROS,   Endo/Other  diabetes, Type 2  Renal/GU negative Renal ROS     Musculoskeletal  (+) Arthritis , Osteoarthritis,    Abdominal (+) + obese (BMI 36.3),   Peds  Hematology Lab Results      Component                Value               Date                      WBC                      6.9                 09/23/2020                HGB                      13.0                09/23/2020                HCT                      40.2                09/23/2020                MCV                      87.4                09/23/2020                PLT                      289                 09/23/2020              Anesthesia Other Findings All: Ciprofloacin  Reproductive/Obstetrics                          Anesthesia Physical Anesthesia Plan  ASA: 3  Anesthesia Plan: Spinal   Post-op Pain Management:    Induction:   PONV Risk Score and Plan: Treatment may vary due to age or medical condition, Midazolam and Ondansetron  Airway Management Planned: Natural Airway and Nasal Cannula  Additional Equipment:   Intra-op Plan:   Post-operative Plan:   Informed Consent: I have reviewed the patients History and Physical, chart, labs and discussed the procedure including the risks, benefits and  alternatives for the proposed anesthesia with the patient or authorized  representative who has indicated his/her understanding and acceptance.     Dental advisory given  Plan Discussed with: CRNA and Anesthesiologist  Anesthesia Plan Comments: (See APP note by Durel Salts, FNP   Spinal)      Anesthesia Quick Evaluation

## 2020-09-29 NOTE — Progress Notes (Signed)
Pt called and LVMM that she had questions in regards to preop instructions.  Called pt at [phone] and questions answered andpt voiced understanding. Pt thought she was to take her Lisinopril am of surgery.  Instructed pt to follow preop instructions.  She is not to take Lisinopril am of surgery.  PT voiced understanding.  Pt aware to take no meds am of surgery since she stated she never got her prescription filled for her PRilosec.  Pt asked when she was to drink G2lower sugar gatorade .  Again asked pt to follow preop instructions she was given and to drink am of surgery and have completed by 0615am .  PT voiced understanding.  All questions answered and pt voiced understanding.

## 2020-09-30 ENCOUNTER — Other Ambulatory Visit: Payer: Self-pay | Admitting: Orthopedic Surgery

## 2020-09-30 DIAGNOSIS — M1611 Unilateral primary osteoarthritis, right hip: Secondary | ICD-10-CM | POA: Diagnosis present

## 2020-09-30 LAB — SARS CORONAVIRUS 2 (TAT 6-24 HRS): SARS Coronavirus 2: NEGATIVE

## 2020-09-30 NOTE — H&P (Signed)
TOTAL HIP ADMISSION H&P  Patient is admitted for right total hip arthroplasty.  Subjective:  Chief Complaint: right hip pain  HPI: Deborah Casey, 65 y.o. female, has a history of pain and functional disability in the right hip(s) due to arthritis and patient has failed non-surgical conservative treatments for greater than 12 weeks to include NSAID's and/or analgesics, corticosteriod injections, use of assistive devices, weight reduction as appropriate, and activity modification.  Onset of symptoms was gradual starting 2 years ago with gradually worsening course since that time.The patient noted no past surgery on the right hip(s).  Patient currently rates pain in the right hip at 10 out of 10 with activity. Patient has night pain, worsening of pain with activity and weight bearing, trendelenberg gait, pain that interfers with activities of daily living, and pain with passive range of motion. Patient has evidence of subchondral cysts, subchondral sclerosis, and joint space narrowing by imaging studies. This condition presents safety issues increasing the risk of falls.  There is no current active infection.  Patient Active Problem List   Diagnosis Date Noted   Osteoarthritis of right hip 09/30/2020   Past Medical History:  Diagnosis Date   Arthritis    Cataract    bilateral but not ready for surgery   Depression    DM (diabetes mellitus), type 2 ([place])    Hypertension     Past Surgical History:  Procedure Laterality Date   CHOLECYSTECTOMY  2005   COLONOSCOPY  02/2009, 1,2013,1,2016   DILATION AND CURETTAGE, DIAGNOSTIC / THERAPEUTIC     UPPER GASTROINTESTINAL ENDOSCOPY     2020    No current facility-administered medications for this encounter.   Current Outpatient Medications  Medication Sig Dispense Refill Last Dose   acetaminophen (TYLENOL) 650 MG CR tablet Take 650-1,300 mg by mouth every 8 (eight) hours as needed for pain.      aspirin-sod bicarb-citric acid (ALKA-SELTZER) 325 MG  TBEF tablet Take 325-650 mg by mouth every 6 (six) hours as needed (indigestion).      cetirizine (ZYRTEC) 10 MG tablet Take 10 mg by mouth at bedtime as needed (itching).      Cholecalciferol (D3-1000) 25 MCG (1000 UT) tablet Take 1,000 Units by mouth daily.      cyanocobalamin 1000 MCG tablet Take 1,000 mcg by mouth daily.      doxylamine, Sleep, (UNISOM) 25 MG tablet Take 25 mg by mouth at bedtime as needed for sleep.      Emollient (CERAVE) CREA Apply 1 application topically daily.      gabapentin (NEURONTIN) 100 MG capsule Take 100 mg by mouth at bedtime.      hydrocortisone 2.5 % cream Apply 1 application topically daily.      ibuprofen (ADVIL,MOTRIN) 800 MG tablet Take 800 mg by mouth every 8 (eight) hours as needed for moderate pain.      JANUMET XR 50-1000 MG TB24 Take 1 tablet by mouth in the morning and at bedtime.      lactase (LACTAID) 3000 units tablet Take 3,000-6,000 Units by mouth daily as needed (consuming dairy).      lisinopril-hydrochlorothiazide (PRINZIDE,ZESTORETIC) 20-12.5 MG tablet Take 1 tablet by mouth daily.      Omega-3 Fatty Acids (FISH OIL) 1000 MG CAPS Take 1,000 mg by mouth daily.      omeprazole (PRILOSEC) 40 MG capsule Take 40 mg by mouth daily as needed (acid reflux).      vitamin C (ASCORBIC ACID) 500 MG tablet Take 500 mg by mouth  daily.      glucose blood test strip FreeStyle Lite Strips (Patient not taking: No sig reported)      Allergies  Allergen Reactions   Ciprofloxacin Itching    Social History   Tobacco Use   Smoking status: Former   Smokeless tobacco: Never  Substance Use Topics   Alcohol use: Yes    Comment: occas    Family History  Problem Relation Age of Onset   Stomach cancer Mother 66   Heart disease Father    Tuberculosis Father    Hypertension Sister    Cancer Sister        appendix   Hypertension Brother    Diabetes Brother    Breast cancer Cousin        20's   Colon cancer Neg Hx    Colon polyps Neg Hx    Esophageal  cancer Neg Hx    Rectal cancer Neg Hx      Review of Systems  Constitutional: Negative.   HENT: Negative.    Eyes: Negative.   Respiratory: Negative.    Cardiovascular: Negative.   Gastrointestinal:  Positive for diarrhea.  Endocrine: Negative.   Genitourinary: Negative.   Musculoskeletal:  Positive for arthralgias and myalgias.  Skin: Negative.   Neurological:  Positive for dizziness.  Hematological: Negative.   Psychiatric/Behavioral: Negative.     Objective:  Physical Exam Constitutional:      Appearance: Normal appearance.  HENT:     Head: Normocephalic and atraumatic.  Eyes:     Pupils: Pupils are equal, round, and reactive to light.  Cardiovascular:     Pulses: Normal pulses.  Pulmonary:     Effort: Pulmonary effort is normal.  Musculoskeletal:        General: Tenderness present.     Cervical back: Normal range of motion and neck supple.     Comments: Patient walks with a profound right-sided limp. Any attempted internal rotation of the right hip causes significant pain. Foot tap is negative. Both knees have 5 flexion contractures. She is tender along the medical joint lines. Skin is intact. Neurovascularly intact distally except she does have numbness to her foot pads.  Skin:    General: Skin is warm and dry.  Neurological:     General: No focal deficit present.     Mental Status: She is alert and oriented to person, place, and time. Mental status is at baseline.  Psychiatric:        Mood and Affect: Mood normal.        Behavior: Behavior normal.        Thought Content: Thought content normal.        Judgment: Judgment normal.    Vital signs in last 24 hours:    Labs:   Estimated body mass index is 36.31 kg/m as calculated from the following:   Height as of 09/23/20: '5\' 3"'$  (1.6 m).   Weight as of 09/23/20: 93 kg.   Imaging Review Plain radiographs demonstrate  AP pelvis and crosstable lateral which show bone-on-bone arthritic changes to the right  hip, lateral subluxation of femoral head peripheral osteophytes and small subchondral cysts in the acetabulum.   Assessment/Plan:  End stage arthritis, right hip(s)  The patient history, physical examination, clinical judgement of the provider and imaging studies are consistent with end stage degenerative joint disease of the right hip(s) and total hip arthroplasty is deemed medically necessary. The treatment options including medical management, injection therapy, arthroscopy and arthroplasty were discussed  at length. The risks and benefits of total hip arthroplasty were presented and reviewed. The risks due to aseptic loosening, infection, stiffness, dislocation/subluxation,  thromboembolic complications and other imponderables were discussed.  The patient acknowledged the explanation, agreed to proceed with the plan and consent was signed. Patient is being admitted for inpatient treatment for surgery, pain control, PT, OT, prophylactic antibiotics, VTE prophylaxis, progressive ambulation and ADL's and discharge planning.The patient is planning to be discharged home with home health services    Patient's anticipated LOS is less than 2 midnights, meeting these requirements: - Younger than 37 - Lives within 1 hour of care - Has a competent adult at home to recover with post-op recover - NO history of  - Chronic pain requiring opiods  - Diabetes  - Coronary Artery Disease  - Heart failure  - Heart attack  - Stroke  - DVT/VTE  - Cardiac arrhythmia  - Respiratory Failure/COPD  - Renal failure  - Anemia  - Advanced Liver disease

## 2020-10-01 ENCOUNTER — Other Ambulatory Visit: Payer: Self-pay

## 2020-10-01 ENCOUNTER — Telehealth: Payer: Self-pay | Admitting: Neurology

## 2020-10-01 MED ORDER — GABAPENTIN 100 MG PO CAPS: 100.0000 mg | ORAL_CAPSULE | Freq: Every day | ORAL | 0 refills | Status: DC

## 2020-10-01 NOTE — Telephone Encounter (Signed)
Refill sent in for pt. 

## 2020-10-01 NOTE — Telephone Encounter (Signed)
Pt stated she needs a refill/new RX for her gabapentin

## 2020-10-01 NOTE — Progress Notes (Signed)
Pt called and left message wanting nurse to call her in regards to clear liquids prior to surgery.  Called pt and spoke with her.  PT aware may have clear liquids from 12 midnite until 0640am morning of surgery.  REminded pt to complete G2Lower sugar drink by 0640am morning of surgery.  Informed pt that list of clear liquids was on preop instructtions.   Instructed pt to eat a good healthy snack prior to bedtime like peanut butter crackers.  PT voiced understanding.

## 2020-10-03 MED ORDER — TRANEXAMIC ACID 1000 MG/10ML IV SOLN
2000.0000 mg | INTRAVENOUS | Status: DC
  Filled 2020-10-03: qty 20

## 2020-10-03 MED ORDER — BUPIVACAINE LIPOSOME 1.3 % IJ SUSP
10.0000 mL | Freq: Once | INTRAMUSCULAR | Status: DC
  Filled 2020-10-03: qty 10

## 2020-10-04 ENCOUNTER — Ambulatory Visit (HOSPITAL_COMMUNITY): Payer: Medicare (Managed Care) | Admitting: Emergency Medicine

## 2020-10-04 ENCOUNTER — Ambulatory Visit (HOSPITAL_COMMUNITY): Payer: Medicare (Managed Care) | Admitting: Anesthesiology

## 2020-10-04 ENCOUNTER — Ambulatory Visit (HOSPITAL_COMMUNITY): Payer: Medicare (Managed Care)

## 2020-10-04 ENCOUNTER — Encounter (HOSPITAL_COMMUNITY)
Admission: RE | Disposition: A | Discharge status: Home / Self Care | Payer: Self-pay | Source: Home / Self Care | Attending: Orthopedic Surgery

## 2020-10-04 ENCOUNTER — Encounter (HOSPITAL_COMMUNITY): Payer: Self-pay | Admitting: Orthopedic Surgery

## 2020-10-04 ENCOUNTER — Other Ambulatory Visit: Payer: Self-pay

## 2020-10-04 ENCOUNTER — Observation Stay (HOSPITAL_COMMUNITY)
Admission: RE | Admit: 2020-10-04 | Discharge: 2020-10-06 | Disposition: A | Discharge status: Home / Self Care | Payer: Medicare (Managed Care) | Attending: Orthopedic Surgery | Admitting: Orthopedic Surgery

## 2020-10-04 DIAGNOSIS — Z7982 Long term (current) use of aspirin: Secondary | ICD-10-CM | POA: Insufficient documentation

## 2020-10-04 DIAGNOSIS — Z7984 Long term (current) use of oral hypoglycemic drugs: Secondary | ICD-10-CM | POA: Insufficient documentation

## 2020-10-04 DIAGNOSIS — M1611 Unilateral primary osteoarthritis, right hip: Principal | ICD-10-CM | POA: Insufficient documentation

## 2020-10-04 DIAGNOSIS — Z87891 Personal history of nicotine dependence: Secondary | ICD-10-CM | POA: Diagnosis not present

## 2020-10-04 DIAGNOSIS — I10 Essential (primary) hypertension: Secondary | ICD-10-CM | POA: Insufficient documentation

## 2020-10-04 DIAGNOSIS — Z419 Encounter for procedure for purposes other than remedying health state, unspecified: Secondary | ICD-10-CM

## 2020-10-04 DIAGNOSIS — E119 Type 2 diabetes mellitus without complications: Secondary | ICD-10-CM | POA: Insufficient documentation

## 2020-10-04 DIAGNOSIS — Z96641 Presence of right artificial hip joint: Secondary | ICD-10-CM

## 2020-10-04 DIAGNOSIS — Z79899 Other long term (current) drug therapy: Secondary | ICD-10-CM | POA: Diagnosis not present

## 2020-10-04 HISTORY — PX: TOTAL HIP ARTHROPLASTY: SHX124

## 2020-10-04 LAB — ABO/RH: ABO/RH(D): O POS

## 2020-10-04 LAB — GLUCOSE, CAPILLARY
Glucose-Capillary: 100 mg/dL — ABNORMAL HIGH (ref 70–99)
Glucose-Capillary: 161 mg/dL — ABNORMAL HIGH (ref 70–99)
Glucose-Capillary: 189 mg/dL — ABNORMAL HIGH (ref 70–99)
Glucose-Capillary: 249 mg/dL — ABNORMAL HIGH (ref 70–99)

## 2020-10-04 LAB — HEMOGLOBIN A1C
Hgb A1c MFr Bld: 6.2 % — ABNORMAL HIGH (ref 4.8–5.6)
Mean Plasma Glucose: 131.24 mg/dL

## 2020-10-04 SURGERY — ARTHROPLASTY, HIP, TOTAL, ANTERIOR APPROACH
Anesthesia: Spinal | Site: Hip | Laterality: Right

## 2020-10-04 MED ORDER — LISINOPRIL 10 MG PO TABS
10.0000 mg | ORAL_TABLET | Freq: Every day | ORAL | Status: DC
  Administered 2020-10-05 – 2020-10-06 (×2): 10 mg via ORAL
  Filled 2020-10-04 (×2): qty 1

## 2020-10-04 MED ORDER — OXYCODONE HCL 5 MG/5ML PO SOLN: 5.0000 mg | Freq: Once | ORAL | Status: DC | PRN

## 2020-10-04 MED ORDER — OXYCODONE-ACETAMINOPHEN 5-325 MG PO TABS: 1.0000 | ORAL_TABLET | ORAL | 0 refills | Status: DC | PRN

## 2020-10-04 MED ORDER — DEXAMETHASONE SODIUM PHOSPHATE 10 MG/ML IJ SOLN
INTRAMUSCULAR | Status: DC | PRN
  Administered 2020-10-04: 10 mg via INTRAVENOUS

## 2020-10-04 MED ORDER — GABAPENTIN 100 MG PO CAPS
100.0000 mg | ORAL_CAPSULE | Freq: Every day | ORAL | Status: DC
  Administered 2020-10-04 – 2020-10-05 (×2): 100 mg via ORAL
  Filled 2020-10-04 (×2): qty 1

## 2020-10-04 MED ORDER — LACTATED RINGERS IV BOLUS
250.0000 mL | Freq: Once | INTRAVENOUS | Status: AC
  Administered 2020-10-04: 250 mL via INTRAVENOUS

## 2020-10-04 MED ORDER — ACETAMINOPHEN 10 MG/ML IV SOLN: 1000.0000 mg | Freq: Once | INTRAVENOUS | Status: DC | PRN

## 2020-10-04 MED ORDER — MENTHOL 3 MG MT LOZG: 1.0000 | LOZENGE | OROMUCOSAL | Status: DC | PRN

## 2020-10-04 MED ORDER — METHOCARBAMOL 500 MG IVPB - SIMPLE MED
500.0000 mg | Freq: Four times a day (QID) | INTRAVENOUS | Status: DC | PRN
  Filled 2020-10-04: qty 50

## 2020-10-04 MED ORDER — ACETAMINOPHEN 325 MG PO TABS
325.0000 mg | ORAL_TABLET | Freq: Four times a day (QID) | ORAL | Status: DC | PRN
  Administered 2020-10-05 – 2020-10-06 (×2): 650 mg via ORAL
  Filled 2020-10-04 (×2): qty 2

## 2020-10-04 MED ORDER — FENTANYL CITRATE (PF) 100 MCG/2ML IJ SOLN
INTRAMUSCULAR | Status: AC
  Filled 2020-10-04: qty 2

## 2020-10-04 MED ORDER — SITAGLIP PHOS-METFORMIN HCL ER 50-1000 MG PO TB24: 1.0000 | ORAL_TABLET | Freq: Two times a day (BID) | ORAL | Status: DC

## 2020-10-04 MED ORDER — SODIUM CHLORIDE 0.9% FLUSH
INTRAVENOUS | Status: DC | PRN
  Administered 2020-10-04: 50 mL

## 2020-10-04 MED ORDER — FLEET ENEMA 7-19 GM/118ML RE ENEM: 1.0000 | ENEMA | Freq: Once | RECTAL | Status: DC | PRN

## 2020-10-04 MED ORDER — LACTATED RINGERS IV SOLN: INTRAVENOUS | Status: DC

## 2020-10-04 MED ORDER — OXYCODONE HCL 5 MG PO TABS: 5.0000 mg | ORAL_TABLET | Freq: Once | ORAL | Status: DC | PRN

## 2020-10-04 MED ORDER — TIZANIDINE HCL 2 MG PO TABS: 2.0000 mg | ORAL_TABLET | Freq: Four times a day (QID) | ORAL | 0 refills | Status: AC | PRN

## 2020-10-04 MED ORDER — PANTOPRAZOLE SODIUM 40 MG PO TBEC
40.0000 mg | DELAYED_RELEASE_TABLET | Freq: Every day | ORAL | Status: DC
Start: 2020-10-04 — End: 2020-10-06
  Administered 2020-10-04 – 2020-10-06 (×2): 40 mg via ORAL
  Filled 2020-10-04 (×2): qty 1

## 2020-10-04 MED ORDER — METOCLOPRAMIDE HCL 5 MG PO TABS: 5.0000 mg | ORAL_TABLET | Freq: Three times a day (TID) | ORAL | Status: DC | PRN

## 2020-10-04 MED ORDER — PHENYLEPHRINE 40 MCG/ML (10ML) SYRINGE FOR IV PUSH (FOR BLOOD PRESSURE SUPPORT)
PREFILLED_SYRINGE | INTRAVENOUS | Status: DC | PRN
  Administered 2020-10-04 (×3): 80 ug via INTRAVENOUS

## 2020-10-04 MED ORDER — ONDANSETRON HCL 4 MG/2ML IJ SOLN: 4.0000 mg | Freq: Four times a day (QID) | INTRAMUSCULAR | Status: DC | PRN

## 2020-10-04 MED ORDER — HYDROCHLOROTHIAZIDE 12.5 MG PO CAPS
12.5000 mg | ORAL_CAPSULE | Freq: Every day | ORAL | Status: DC
  Administered 2020-10-05: 12.5 mg via ORAL
  Filled 2020-10-04 (×2): qty 1

## 2020-10-04 MED ORDER — KCL IN DEXTROSE-NACL 20-5-0.45 MEQ/L-%-% IV SOLN
INTRAVENOUS | Status: DC
  Filled 2020-10-04 (×4): qty 1000

## 2020-10-04 MED ORDER — BISACODYL 5 MG PO TBEC: 5.0000 mg | DELAYED_RELEASE_TABLET | Freq: Every day | ORAL | Status: DC | PRN

## 2020-10-04 MED ORDER — AMISULPRIDE (ANTIEMETIC) 5 MG/2ML IV SOLN: 10.0000 mg | Freq: Once | INTRAVENOUS | Status: AC | PRN

## 2020-10-04 MED ORDER — ONDANSETRON HCL 4 MG/2ML IJ SOLN
INTRAMUSCULAR | Status: AC
  Filled 2020-10-04: qty 2

## 2020-10-04 MED ORDER — METFORMIN HCL 500 MG PO TABS
1000.0000 mg | ORAL_TABLET | Freq: Two times a day (BID) | ORAL | Status: DC
  Administered 2020-10-05 – 2020-10-06 (×3): 1000 mg via ORAL
  Filled 2020-10-04 (×3): qty 2

## 2020-10-04 MED ORDER — AMISULPRIDE (ANTIEMETIC) 5 MG/2ML IV SOLN
INTRAVENOUS | Status: AC
  Administered 2020-10-04: 10 mg via INTRAVENOUS
  Filled 2020-10-04: qty 4

## 2020-10-04 MED ORDER — ONDANSETRON HCL 4 MG PO TABS: 4.0000 mg | ORAL_TABLET | Freq: Four times a day (QID) | ORAL | Status: DC | PRN

## 2020-10-04 MED ORDER — INSULIN ASPART 100 UNIT/ML IJ SOLN
0.0000 [IU] | Freq: Three times a day (TID) | INTRAMUSCULAR | Status: DC
  Administered 2020-10-04 – 2020-10-05 (×3): 3 [IU] via SUBCUTANEOUS
  Administered 2020-10-05: 5 [IU] via SUBCUTANEOUS
  Administered 2020-10-06: 2 [IU] via SUBCUTANEOUS

## 2020-10-04 MED ORDER — HYDROMORPHONE HCL 1 MG/ML IJ SOLN: 0.2500 mg | INTRAMUSCULAR | Status: DC | PRN

## 2020-10-04 MED ORDER — DEXAMETHASONE SODIUM PHOSPHATE 10 MG/ML IJ SOLN
10.0000 mg | Freq: Once | INTRAMUSCULAR | Status: AC
Start: 2020-10-05 — End: 2020-10-05
  Administered 2020-10-05: 10 mg via INTRAVENOUS
  Filled 2020-10-04: qty 1

## 2020-10-04 MED ORDER — ONDANSETRON HCL 4 MG/2ML IJ SOLN
INTRAMUSCULAR | Status: DC | PRN
  Administered 2020-10-04: 4 mg via INTRAVENOUS

## 2020-10-04 MED ORDER — ASPIRIN 81 MG PO CHEW
81.0000 mg | CHEWABLE_TABLET | Freq: Two times a day (BID) | ORAL | Status: DC
Start: 2020-10-04 — End: 2020-10-06
  Administered 2020-10-04 – 2020-10-06 (×4): 81 mg via ORAL
  Filled 2020-10-04 (×4): qty 1

## 2020-10-04 MED ORDER — PROPOFOL 10 MG/ML IV BOLUS
INTRAVENOUS | Status: AC
  Filled 2020-10-04: qty 20

## 2020-10-04 MED ORDER — ONDANSETRON HCL 4 MG/2ML IJ SOLN: 4.0000 mg | Freq: Once | INTRAMUSCULAR | Status: DC | PRN

## 2020-10-04 MED ORDER — CHLORHEXIDINE GLUCONATE 0.12 % MT SOLN
15.0000 mL | Freq: Once | OROMUCOSAL | Status: AC
  Administered 2020-10-04: 15 mL via OROMUCOSAL

## 2020-10-04 MED ORDER — MIDAZOLAM HCL 2 MG/2ML IJ SOLN
INTRAMUSCULAR | Status: AC
  Filled 2020-10-04: qty 2

## 2020-10-04 MED ORDER — HYDROMORPHONE HCL 1 MG/ML IJ SOLN: 0.5000 mg | INTRAMUSCULAR | Status: DC | PRN

## 2020-10-04 MED ORDER — ORAL CARE MOUTH RINSE
15.0000 mL | Freq: Once | OROMUCOSAL | Status: AC
Start: 2020-10-04 — End: 2020-10-04

## 2020-10-04 MED ORDER — POLYETHYLENE GLYCOL 3350 17 G PO PACK
17.0000 g | PACK | Freq: Every day | ORAL | Status: DC | PRN
  Administered 2020-10-05: 17 g via ORAL
  Filled 2020-10-04: qty 1

## 2020-10-04 MED ORDER — 0.9 % SODIUM CHLORIDE (POUR BTL) OPTIME
TOPICAL | Status: DC | PRN
  Administered 2020-10-04: 1000 mL

## 2020-10-04 MED ORDER — TRANEXAMIC ACID-NACL 1000-0.7 MG/100ML-% IV SOLN: 1000.0000 mg | Freq: Once | INTRAVENOUS | Status: DC

## 2020-10-04 MED ORDER — LORATADINE 10 MG PO TABS
10.0000 mg | ORAL_TABLET | Freq: Every day | ORAL | Status: DC
  Administered 2020-10-06: 10 mg via ORAL
  Filled 2020-10-04 (×2): qty 1

## 2020-10-04 MED ORDER — BUPIVACAINE-EPINEPHRINE (PF) 0.25% -1:200000 IJ SOLN
INTRAMUSCULAR | Status: AC
  Filled 2020-10-04: qty 30

## 2020-10-04 MED ORDER — POVIDONE-IODINE 10 % EX SWAB
2.0000 "application " | Freq: Once | CUTANEOUS | Status: AC
  Administered 2020-10-04: 2 via TOPICAL

## 2020-10-04 MED ORDER — DEXAMETHASONE SODIUM PHOSPHATE 10 MG/ML IJ SOLN
INTRAMUSCULAR | Status: AC
  Filled 2020-10-04: qty 1

## 2020-10-04 MED ORDER — TRANEXAMIC ACID 1000 MG/10ML IV SOLN
INTRAVENOUS | Status: DC | PRN
  Administered 2020-10-04: 2000 mg via TOPICAL

## 2020-10-04 MED ORDER — LISINOPRIL-HYDROCHLOROTHIAZIDE 20-12.5 MG PO TABS: 1.0000 | ORAL_TABLET | Freq: Every day | ORAL | Status: DC

## 2020-10-04 MED ORDER — ASPIRIN EC 81 MG PO TBEC: 81.0000 mg | DELAYED_RELEASE_TABLET | Freq: Two times a day (BID) | ORAL | 0 refills | Status: AC

## 2020-10-04 MED ORDER — PROPOFOL 500 MG/50ML IV EMUL
INTRAVENOUS | Status: DC | PRN
  Administered 2020-10-04: 75 ug/kg/min via INTRAVENOUS

## 2020-10-04 MED ORDER — TRANEXAMIC ACID-NACL 1000-0.7 MG/100ML-% IV SOLN
1000.0000 mg | INTRAVENOUS | Status: AC
  Administered 2020-10-04: 1000 mg via INTRAVENOUS
  Filled 2020-10-04: qty 100

## 2020-10-04 MED ORDER — PHENOL 1.4 % MT LIQD: 1.0000 | OROMUCOSAL | Status: DC | PRN

## 2020-10-04 MED ORDER — ACETAMINOPHEN 10 MG/ML IV SOLN
INTRAVENOUS | Status: AC
  Administered 2020-10-04: 1000 mg via INTRAVENOUS
  Filled 2020-10-04: qty 100

## 2020-10-04 MED ORDER — EPHEDRINE SULFATE-NACL 50-0.9 MG/10ML-% IV SOSY
PREFILLED_SYRINGE | INTRAVENOUS | Status: DC | PRN
  Administered 2020-10-04: 10 mg via INTRAVENOUS

## 2020-10-04 MED ORDER — MIDAZOLAM HCL 5 MG/5ML IJ SOLN
INTRAMUSCULAR | Status: DC | PRN
  Administered 2020-10-04: 2 mg via INTRAVENOUS

## 2020-10-04 MED ORDER — DOXYLAMINE SUCCINATE (SLEEP) 25 MG PO TABS
25.0000 mg | ORAL_TABLET | Freq: Every evening | ORAL | Status: DC | PRN
  Administered 2020-10-06: 25 mg via ORAL
  Filled 2020-10-04 (×3): qty 1

## 2020-10-04 MED ORDER — BUPIVACAINE IN DEXTROSE 0.75-8.25 % IT SOLN
INTRATHECAL | Status: DC | PRN
  Administered 2020-10-04: 1.6 mL via INTRATHECAL

## 2020-10-04 MED ORDER — BUPIVACAINE-EPINEPHRINE 0.25% -1:200000 IJ SOLN
INTRAMUSCULAR | Status: DC | PRN
  Administered 2020-10-04: 30 mL

## 2020-10-04 MED ORDER — CEFAZOLIN SODIUM-DEXTROSE 2-4 GM/100ML-% IV SOLN
2.0000 g | INTRAVENOUS | Status: AC
  Administered 2020-10-04: 2 g via INTRAVENOUS
  Filled 2020-10-04: qty 100

## 2020-10-04 MED ORDER — DIPHENHYDRAMINE HCL 12.5 MG/5ML PO ELIX: 12.5000 mg | ORAL_SOLUTION | ORAL | Status: DC | PRN

## 2020-10-04 MED ORDER — ALUM & MAG HYDROXIDE-SIMETH 200-200-20 MG/5ML PO SUSP
30.0000 mL | ORAL | Status: DC | PRN
  Administered 2020-10-05: 30 mL via ORAL
  Filled 2020-10-04: qty 30

## 2020-10-04 MED ORDER — OXYCODONE HCL 5 MG PO TABS
10.0000 mg | ORAL_TABLET | ORAL | Status: DC | PRN
  Administered 2020-10-05: 15 mg via ORAL
  Filled 2020-10-04: qty 3

## 2020-10-04 MED ORDER — LINAGLIPTIN 5 MG PO TABS
5.0000 mg | ORAL_TABLET | Freq: Every day | ORAL | Status: DC
  Administered 2020-10-05 – 2020-10-06 (×2): 5 mg via ORAL
  Filled 2020-10-04 (×2): qty 1

## 2020-10-04 MED ORDER — METOCLOPRAMIDE HCL 5 MG/ML IJ SOLN: 5.0000 mg | Freq: Three times a day (TID) | INTRAMUSCULAR | Status: DC | PRN

## 2020-10-04 MED ORDER — STERILE WATER FOR IRRIGATION IR SOLN
Status: DC | PRN
  Administered 2020-10-04: 2000 mL

## 2020-10-04 MED ORDER — DOCUSATE SODIUM 100 MG PO CAPS
100.0000 mg | ORAL_CAPSULE | Freq: Two times a day (BID) | ORAL | Status: DC
Start: 2020-10-04 — End: 2020-10-06
  Administered 2020-10-04 – 2020-10-06 (×3): 100 mg via ORAL
  Filled 2020-10-04 (×4): qty 1

## 2020-10-04 MED ORDER — FENTANYL CITRATE (PF) 100 MCG/2ML IJ SOLN
INTRAMUSCULAR | Status: DC | PRN
  Administered 2020-10-04: 100 ug via INTRAVENOUS

## 2020-10-04 MED ORDER — PROPOFOL 10 MG/ML IV BOLUS
INTRAVENOUS | Status: DC | PRN
  Administered 2020-10-04: 20 mg via INTRAVENOUS
  Administered 2020-10-04: 30 mg via INTRAVENOUS

## 2020-10-04 MED ORDER — OXYCODONE HCL 5 MG PO TABS
5.0000 mg | ORAL_TABLET | ORAL | Status: DC | PRN
  Administered 2020-10-04 (×2): 5 mg via ORAL
  Administered 2020-10-05 – 2020-10-06 (×3): 10 mg via ORAL
  Filled 2020-10-04 (×2): qty 2
  Filled 2020-10-04: qty 1
  Filled 2020-10-04: qty 2
  Filled 2020-10-04: qty 1

## 2020-10-04 MED ORDER — METHOCARBAMOL 500 MG IVPB - SIMPLE MED
INTRAVENOUS | Status: AC
  Administered 2020-10-04: 500 mg via INTRAVENOUS
  Filled 2020-10-04: qty 50

## 2020-10-04 MED ORDER — BUPIVACAINE LIPOSOME 1.3 % IJ SUSP
INTRAMUSCULAR | Status: DC | PRN
  Administered 2020-10-04: 10 mL

## 2020-10-04 MED ORDER — OXYCODONE HCL 5 MG PO TABS
ORAL_TABLET | ORAL | Status: AC
  Administered 2020-10-04: 10 mg via ORAL
  Filled 2020-10-04: qty 2

## 2020-10-04 MED ORDER — LACTATED RINGERS IV BOLUS
500.0000 mL | Freq: Once | INTRAVENOUS | Status: AC
  Administered 2020-10-04: 500 mL via INTRAVENOUS

## 2020-10-04 MED ORDER — METHOCARBAMOL 500 MG PO TABS
500.0000 mg | ORAL_TABLET | Freq: Four times a day (QID) | ORAL | Status: DC | PRN
  Administered 2020-10-04 – 2020-10-06 (×5): 500 mg via ORAL
  Filled 2020-10-04 (×5): qty 1

## 2020-10-04 SURGICAL SUPPLY — 41 items
BAG COUNTER SPONGE SURGICOUNT (BAG) ×2 IMPLANT
BAG DECANTER FOR FLEXI CONT (MISCELLANEOUS) ×4 IMPLANT
BLADE SAG 18X100X1.27 (BLADE) ×2 IMPLANT
BLADE SAW SGTL 18X1.27X75 (BLADE) ×2 IMPLANT
COVER PERINEAL POST (MISCELLANEOUS) ×2 IMPLANT
COVER SURGICAL LIGHT HANDLE (MISCELLANEOUS) ×2 IMPLANT
CUP ACETBLR 52 OD 100 SERIES (Hips) ×2 IMPLANT
DECANTER SPIKE VIAL GLASS SM (MISCELLANEOUS) ×2 IMPLANT
DRAPE STERI IOBAN 125X83 (DRAPES) ×2 IMPLANT
DRAPE U-SHAPE 47X51 STRL (DRAPES) ×4 IMPLANT
DRSG AQUACEL AG ADV 3.5X10 (GAUZE/BANDAGES/DRESSINGS) ×2 IMPLANT
DURAPREP 26ML APPLICATOR (WOUND CARE) ×2 IMPLANT
ELECT BLADE TIP CTD 4 INCH (ELECTRODE) ×2 IMPLANT
ELECT REM PT RETURN 15FT ADLT (MISCELLANEOUS) ×2 IMPLANT
ELIMINATOR HOLE APEX DEPUY (Hips) ×2 IMPLANT
GLOVE SRG 8 PF TXTR STRL LF DI (GLOVE) ×1 IMPLANT
GLOVE SURG ENC MOIS LTX SZ7.5 (GLOVE) ×2 IMPLANT
GLOVE SURG ENC MOIS LTX SZ8.5 (GLOVE) ×2 IMPLANT
GLOVE SURG UNDER POLY LF SZ8 (GLOVE) ×1
GLOVE SURG UNDER POLY LF SZ9 (GLOVE) ×2 IMPLANT
GOWN STRL REUS W/TWL XL LVL3 (GOWN DISPOSABLE) ×4 IMPLANT
HEAD CERAMIC DELTA 36 PLUS 1.5 (Hips) ×2 IMPLANT
HOLDER FOLEY CATH W/STRAP (MISCELLANEOUS) ×2 IMPLANT
KIT TURNOVER KIT A (KITS) ×2 IMPLANT
LINER NEUTRAL 52X36MM PLUS 4 (Liner) ×2 IMPLANT
MANIFOLD NEPTUNE II (INSTRUMENTS) ×2 IMPLANT
NEEDLE HYPO 21X1.5 SAFETY (NEEDLE) ×4 IMPLANT
NS IRRIG 1000ML POUR BTL (IV SOLUTION) ×2 IMPLANT
PACK ANTERIOR HIP CUSTOM (KITS) ×2 IMPLANT
STEM FEM ACTIS HIGH SZ3 (Stem) ×2 IMPLANT
SUT ETHIBOND NAB CT1 #1 30IN (SUTURE) ×2 IMPLANT
SUT VIC AB 0 CT1 27 (SUTURE)
SUT VIC AB 0 CT1 27XBRD ANBCTR (SUTURE) IMPLANT
SUT VIC AB 1 CTX 36 (SUTURE) ×1
SUT VIC AB 1 CTX36XBRD ANBCTR (SUTURE) ×1 IMPLANT
SUT VIC AB 2-0 CT1 27 (SUTURE) ×1
SUT VIC AB 2-0 CT1 TAPERPNT 27 (SUTURE) ×1 IMPLANT
SUT VIC AB 3-0 CT1 27 (SUTURE) ×1
SUT VIC AB 3-0 CT1 TAPERPNT 27 (SUTURE) ×1 IMPLANT
SYR CONTROL 10ML LL (SYRINGE) ×6 IMPLANT
TRAY FOLEY MTR SLVR 16FR STAT (SET/KITS/TRAYS/PACK) IMPLANT

## 2020-10-04 NOTE — Anesthesia Procedure Notes (Signed)
Spinal  Patient location during procedure: OR End time: 10/04/2020 9:45 AM Reason for block: surgical anesthesia Staffing Performed: resident/CRNA  Resident/CRNA: Herma Uballe D, CRNA Preanesthetic Checklist Completed: patient identified, IV checked, site marked, risks and benefits discussed, surgical consent, monitors and equipment checked, pre-op evaluation and timeout performed Spinal Block Patient position: sitting Prep: DuraPrep Patient monitoring: heart rate, continuous pulse ox and blood pressure Approach: right paramedian Location: L3-4 Injection technique: single-shot Needle Needle type: Pencan  Needle gauge: 24 G Needle length: 9 cm Assessment Sensory level: T6 Events: CSF return Additional Notes

## 2020-10-04 NOTE — Evaluation (Signed)
Physical Therapy Evaluation Patient Details Name: Deborah Casey MRN: [id number] DOB: [birth year] Today's Date: 10/04/2020   History of Present Illness  Patient is 65 y.o. female s/p Rt THA anterior approach on 10/04/20 with PMH significant for OA, HTN, DM, depression.   Clinical Impression  Deborah Casey is a 65 y.o. female POD 0 s/p Rt THA. Patient reports independence with mobility at baseline. Patient is now limited by functional impairments (see PT problem list below) and requires min-mod assist for bed mobility and transfers with RW. Patient was greatly limited by pain this date and unable to advance to gait training. Patient has expressed som concerns regarding amount of physical assist her sister can provide while she recovers. Patient will benefit from continued skilled PT interventions to address impairments and progress towards PLOF. Acute PT will follow to progress mobility and stair training in preparation for safe discharge home.     Follow Up Recommendations Follow surgeon's recommendation for DC plan and follow-up therapies    Equipment Recommendations  Rolling walker with 5" wheels;3in1 (PT)    Recommendations for Other Services       Precautions / Restrictions Precautions Precautions: Fall Restrictions Weight Bearing Restrictions: No Other Position/Activity Restrictions: WBAT      Mobility  Bed Mobility Overal bed mobility: Needs Assistance Bed Mobility: Supine to Sit;Sit to Supine     Supine to sit: Min assist;HOB elevated Sit to supine: Mod assist   General bed mobility comments: Cues to walk LE's to EOB required and assist for Rt LE off EOB as well as to raise trunk upright. pt limited by pain. EOS required mod assist to bring Bil LE's onto bed and 2+ assist to scoot superiorly in bed.    Transfers Overall transfer level: Needs assistance Equipment used: Rolling walker (2 wheeled) Transfers: Sit to/from Stand Sit to Stand: Min assist;From elevated  surface         General transfer comment: cues for hand placement on RW and min assist to power up from stretcher. Pt unable to take steps at bedside and stretcher removed and hospital bed placed behind pt. Cues required for safe reach back to sit EOB.  Ambulation/Gait                Stairs            Wheelchair Mobility    Modified Rankin (Stroke Patients Only)       Balance Overall balance assessment: Needs assistance Sitting-balance support: Feet supported Sitting balance-Leahy Scale: Fair     Standing balance support: During functional activity;Bilateral upper extremity supported Standing balance-Leahy Scale: Poor                               Pertinent Vitals/Pain Pain Assessment: Faces Faces Pain Scale: Hurts worst Pain Location: Rt hip Pain Descriptors / Indicators: Aching;Discomfort;Crying;Grimacing;Guarding;Moaning Pain Intervention(s): Monitored during session;Limited activity within patient's tolerance;Premedicated before session;Repositioned    Home Living Family/patient expects to be discharged to:: Private residence Living Arrangements: Alone Available Help at Discharge: Family Type of Home: Apartment Home Access: Level entry (maybe small step/curb)     Home Layout: One level Home Equipment: Toilet riser Additional Comments: older sister will stay with her but pt seems to have concerns about how much help she can provide    Prior Function Level of Independence: Independent               Hand Dominance   Dominant Hand:  Right    Extremity/Trunk Assessment   Upper Extremity Assessment Upper Extremity Assessment: Overall WFL for tasks assessed    Lower Extremity Assessment Lower Extremity Assessment:  (limited by pain)    Cervical / Trunk Assessment Cervical / Trunk Assessment: Normal  Communication   Communication: No difficulties  Cognition Arousal/Alertness: Awake/alert Behavior During Therapy: WFL for  tasks assessed/performed Overall Cognitive Status: Within Functional Limits for tasks assessed                                        General Comments      Exercises     Assessment/Plan    PT Assessment Patient needs continued PT services  PT Problem List Decreased strength;Decreased range of motion;Decreased activity tolerance;Decreased balance;Decreased mobility;Decreased knowledge of use of DME;Decreased knowledge of precautions;Pain       PT Treatment Interventions DME instruction;Gait training;Stair training;Functional mobility training;Therapeutic activities;Therapeutic exercise;Balance training;Patient/family education    PT Goals (Current goals can be found in the Care Plan section)  Acute Rehab PT Goals Patient Stated Goal: stop hurting PT Goal Formulation: With patient Time For Goal Achievement: 10/11/20 Potential to Achieve Goals: Good    Frequency 7X/week   Barriers to discharge        Co-evaluation               AM-PAC PT "6 Clicks" Mobility  Outcome Measure Help needed turning from your back to your side while in a flat bed without using bedrails?: A Little Help needed moving from lying on your back to sitting on the side of a flat bed without using bedrails?: A Lot Help needed moving to and from a bed to a chair (including a wheelchair)?: A Lot Help needed standing up from a chair using your arms (e.g., wheelchair or bedside chair)?: A Lot Help needed to walk in hospital room?: A Lot Help needed climbing 3-5 steps with a railing? : A Lot 6 Click Score: 13    End of Session Equipment Utilized During Treatment: Gait belt Activity Tolerance: Patient tolerated treatment well Patient left: in chair;with call bell/phone within reach Nurse Communication: Mobility status PT Visit Diagnosis: Muscle weakness (generalized) (M62.81);Difficulty in walking, not elsewhere classified (R26.2)    Time: [id number] PT Time Calculation (min) (ACUTE  ONLY): 23 min   Charges:   PT Evaluation $PT Eval Low Complexity: 1 Low          Verner Mould, DPT Acute Rehabilitation Services Office [phone] Pager [phone]   Jacques Navy 10/04/2020, 5:28 PM

## 2020-10-04 NOTE — Interval H&P Note (Signed)
History and Physical Interval Note:  10/04/2020 8:55 AM  Deborah Casey  has presented today for surgery, with the diagnosis of RIGHT HIP OSTEOARTHRITIS.  The various methods of treatment have been discussed with the patient and family. After consideration of risks, benefits and other options for treatment, the patient has consented to  Procedure(s): RIGHT TOTAL HIP ARTHROPLASTY ANTERIOR APPROACH (Right) as a surgical intervention.  The patient's history has been reviewed, patient examined, no change in status, stable for surgery.  I have reviewed the patient's chart and labs.  Questions were answered to the patient's satisfaction.     [name]

## 2020-10-04 NOTE — Discharge Instructions (Signed)

## 2020-10-04 NOTE — Anesthesia Postprocedure Evaluation (Signed)
Anesthesia Post Note  Patient: Deborah Casey  Procedure(s) Performed: RIGHT TOTAL HIP ARTHROPLASTY ANTERIOR APPROACH (Right: Hip)     Patient location during evaluation: Nursing Unit Anesthesia Type: Spinal Level of consciousness: oriented and awake and alert Pain management: pain level controlled Vital Signs Assessment: post-procedure vital signs reviewed and stable Respiratory status: spontaneous breathing and respiratory function stable Cardiovascular status: blood pressure returned to baseline and stable Postop Assessment: no headache, no backache, no apparent nausea or vomiting and patient able to bend at knees Anesthetic complications: no   No notable events documented.  Last Vitals:  Vitals:   10/04/20 1330 10/04/20 1336  BP: 116/70 108/65  Pulse: (!) 58 67  Resp: 16 16  Temp:    SpO2: 96% 96%    Last Pain:  Vitals:   10/04/20 1336  TempSrc:   PainSc: 0-No pain                 Barnet Glasgow

## 2020-10-04 NOTE — Op Note (Signed)
PATIENT ID:      Deborah Casey  MRN:     [id number] DOB/AGE:    April 06, 1955 / 65 y.o.  OPERATIVE REPORT   DATE OF PROCEDURE:  10/04/2020      PREOPERATIVE DIAGNOSIS:  RIGHT HIP OSTEOARTHRITIS                                                         POSTOPERATIVE DIAGNOSIS:  Same                                                         PROCEDURE: Anterior R total hip arthroplasty using a 54m mm DePuy Pinnacle  Cup, [employer] 0-degree polyethylene liner, a +1.5 mm x 338mceramic head, a 3 hi Depuy Actis stem  SURGEON: FrKerin SalenASSISTANT:   ErKerry HoughPhSempra Energy(present throughout entire procedure and necessary for timely completion of the procedure)   ANESTHESIA: Spinal, Exparel '133mg'$  injection BLOOD LOSS: 400 cc FLUID REPLACEMENT: 1500 cc crystalloid TRANEXAMIC ACID: 1gm IV, 2gm Topical COMPLICATIONS: none    INDICATIONS FOR PROCEDURE: A [age].o. year-old With  RIGHT HIP OSTEOARTHRITIS   for 3 years, x-rays show bone-on-bone arthritic changes, and osteophytes. Despite conservative measures with observation, anti-inflammatory medicine, narcotics, use of a cane, has severe unremitting pain and can ambulate only a few blocks before resting. Patient desires elective R total hip arthroplasty to decrease pain and increase function. The risks, benefits, and alternatives were discussed at length including but not limited to the risks of infection, bleeding, nerve injury, stiffness, blood clots, the need for revision surgery, cardiopulmonary complications, among others, and they were willing to proceed. Questions answered      PROCEDURE IN DETAIL: The patient was identified by armband,   [facility] , appropriate anesthetic monitors   were attached and anesthesia was induced with the patient on the gurney. HANA boots were applied to the feet, and the patient  was transferred to the HANA  table with a peroneal post and support underneath the non-operative leg. Theoperative lower extremity was then prepped and draped in the usual sterile fashion from just above the iliac crest to the knee. And a timeout procedure was performed. ErKerry HoughPhHardin NegusACascade Medical Centeras present and scrubbed throughout the case, critical for assistance with, positioning, exposure, retraction, instrumentation, and closure.Skin along incision area was injected with 10 cc of Exparel solution. We then made a 14 cm incision along the interval at the leading edge of the tensor fascia lata of starting at 2 cm lateral to the ASIS. Small bleeders in the skin and subcutaneous tissue identified and cauterized we dissected down to the fascia and made an incision in the fascia allowing usK[place]ao elevate the fascia of the tensor muscle and exploited the interval between the rectus and the tensor fascia lata. A Cobra retractor was then placed along the superior neck of the femur. A cerebellar retractor was used to expose the interval between the tensor fascia lata and the rectus femoris.  We identified and  cauterized the ascending branch of the anterior circumflex artery. A second Cobra retractor along the inferior neck of the femur. A small Hohmann retractor was placed underneath the origin of the rectus femoris, giving [place] good medial exposure. Using [place] fatty tissue was removed from in front of the anterior capsule. The capsule was then incised, starting out at the superior anterior rim of the acetabulum going laterally along the anterior neck. The capsule was then teed along the neck superiorly and inferiorly. Electrocautery was used to release capsule from the anterior and medial neck of the femur to allow external rotation. Cobra retractors were then placed along the inferior and superior neck allowing [place] to perform a standard neck cut and removed the femoral head with a power corkscrew. We then placed a medium bent homan retractor in the  cotyloid notch and posteriorly along the acetabular rim a narrow Cobra retractor. Exposed labral tissue and osteophytes were then removed. We then sequentially reamed up to a 53 mm basket reamer obtaining good coverage in all quadrants, verified by C-arm imaging. Under C-arm control we then hammered into place a 54 mm Pinnacle cup in 45 of abduction and 15 of anteversion. The cup seated nicely and required no supplemental screws. We then placed a central hole Eliminator and a 0 polyethylene liner. The foot was then externally rotated to 130-140. The limb was extended and adducted to the floor, delivering the proximal femur up into the wound. A medium curved Hohmann retractor was placed over the greater trochanter and a long Homan retractor along the posterior femoral neck completing the exposure and lateralizing the femur. We then performed releases superiorly and and inferiorly of the capsule going back to the pirformis fossa superiorly and to the lesser trochanter inferiorly. We then entered the proximal femur with the box cutting offset chisel followed by, a canal sounder, the chili pepper and broaching up to a 3 hi broach. This seated nicely and we reamed the calcar. A trial reduction was performed with a 1.5 mm X 36 mm head.The limb lengths were excellent the hip was stable in 90 of external rotation. At this point the trial components removed and we hammered into place a # 3 hi  Offset Actis stem with [employer] coating. A + 1.5 mm x 36 ceramic head was then hammered into place. The hip was reduced and final C-arm images obtained. The wound was thoroughly irrigated with normal saline solution. We repaired the ant capsule and the tensor fascia lot a with running 0 vicryl suture. the subcutaneous tissue was closed with 2-0 and 3-0 Vicryl suture followed by an Aquacil dressing. At this point the patient was awaken and transferred to hospital gurney without difficulty.   [name] 10/04/2020, 8:56 AM

## 2020-10-04 NOTE — Transfer of Care (Signed)
Immediate Anesthesia Transfer of Care Note  Patient: Deborah Casey  Procedure(s) Performed: RIGHT TOTAL HIP ARTHROPLASTY ANTERIOR APPROACH (Right: Hip)  Patient Location: PACU  Anesthesia Type:Spinal  Level of Consciousness: awake, alert  and oriented  Airway & Oxygen Therapy: Patient Spontanous Breathing and Patient connected to face mask oxygen  Post-op Assessment: Report given to RN and Post -op Vital signs reviewed and stable  Post vital signs: Reviewed and stable  Last Vitals:  Vitals Value Taken Time  BP    Temp    Pulse 86 10/04/20 1204  Resp 17 10/04/20 1204  SpO2 97 % 10/04/20 1204  Vitals shown include unvalidated device data.  Last Pain:  Vitals:   10/04/20 0749  TempSrc:   PainSc: 0-No pain         Complications: No notable events documented.

## 2020-10-05 ENCOUNTER — Encounter (HOSPITAL_COMMUNITY): Payer: Self-pay | Admitting: Orthopedic Surgery

## 2020-10-05 DIAGNOSIS — M1611 Unilateral primary osteoarthritis, right hip: Secondary | ICD-10-CM | POA: Diagnosis not present

## 2020-10-05 LAB — CBC
HCT: 32.7 % — ABNORMAL LOW (ref 36.0–46.0)
Hemoglobin: 11 g/dL — ABNORMAL LOW (ref 12.0–15.0)
MCH: 29.2 pg (ref 26.0–34.0)
MCHC: 33.6 g/dL (ref 30.0–36.0)
MCV: 86.7 fL (ref 80.0–100.0)
Platelets: 158 10*3/uL (ref 150–400)
RBC: 3.77 MIL/uL — ABNORMAL LOW (ref 3.87–5.11)
RDW: 14.1 % (ref 11.5–15.5)
WBC: 13 10*3/uL — ABNORMAL HIGH (ref 4.0–10.5)
nRBC: 0 % (ref 0.0–0.2)

## 2020-10-05 LAB — BASIC METABOLIC PANEL
Anion gap: 11 (ref 5–15)
BUN: 11 mg/dL (ref 8–23)
CO2: 22 mmol/L (ref 22–32)
Calcium: 8.4 mg/dL — ABNORMAL LOW (ref 8.9–10.3)
Chloride: 105 mmol/L (ref 98–111)
Creatinine, Ser: 0.7 mg/dL (ref 0.44–1.00)
GFR, Estimated: >60 mL/min (ref >60)
Glucose, Bld: 171 mg/dL — ABNORMAL HIGH (ref 70–99)
Potassium: 3.9 mmol/L (ref 3.5–5.1)
Sodium: 138 mmol/L (ref 135–145)

## 2020-10-05 LAB — GLUCOSE, CAPILLARY
Glucose-Capillary: 192 mg/dL — ABNORMAL HIGH (ref 70–99)
Glucose-Capillary: 211 mg/dL — ABNORMAL HIGH (ref 70–99)
Glucose-Capillary: 154 mg/dL — ABNORMAL HIGH (ref 70–99)
Glucose-Capillary: 144 mg/dL — ABNORMAL HIGH (ref 70–99)

## 2020-10-05 NOTE — Progress Notes (Signed)
PATIENT ID: Deborah Casey  MRN: [id number]  DOB/AGE:  1955-12-30 / 65 y.o.  1 Day Post-Op Procedure(s) (LRB): RIGHT TOTAL HIP ARTHROPLASTY ANTERIOR APPROACH (Right)    PROGRESS NOTE Subjective: Patient is alert, oriented, no Nausea, no Vomiting, yes passing gas, . Taking PO well. Denies SOB, Chest or Calf Pain. Using Incentive Spirometer, PAS in place. Ambulate WBAT Patient reports pain as  6/10  .    Objective: Vital signs in last 24 hours: Vitals:   10/04/20 1731 10/04/20 2057 10/05/20 0133 10/05/20 0513  BP: 119/69 (!) 142/75 121/60 119/64  Pulse: 65 92 78 82  Resp: '18 17 17 17  '$ Temp: 98.3 F (36.8 C) 98.4 F (36.9 C) 98.4 F (36.9 C) 98.3 F (36.8 C)  TempSrc: Oral Oral    SpO2: 98% 99% 100% 99%  Weight:      Height:          Intake/Output from previous day: I/O last 3 completed shifts: In: 3926.9 [P.O.:240; I.V.:3536.9; IV Piggyback:150] Out: 2200 [Urine:1800; Blood:400]   Intake/Output this shift: No intake/output data recorded.   LABORATORY DATA: Recent Labs    10/04/20 1652 10/04/20 1908 10/04/20 2058 10/05/20 0331  WBC  --   --   --  13.0*  HGB  --   --   --  11.0*  HCT  --   --   --  32.7*  PLT  --   --   --  158  NA  --   --   --  138  K  --   --   --  3.9  CL  --   --   --  105  CO2  --   --   --  22  BUN  --   --   --  11  CREATININE  --   --   --  0.70  GLUCOSE  --   --   --  171*  GLUCAP 161* 189* 249*  --   CALCIUM  --   --   --  8.4*    Examination: Neurologically intact ABD soft Neurovascular intact Sensation intact distally Intact pulses distally Dorsiflexion/Plantar flexion intact Incision: dressing C/D/I No cellulitis present Compartment soft} XR AP&Lat of hip shows well placed\fixed THA  Assessment:   1 Day Post-Op Procedure(s) (LRB): RIGHT TOTAL HIP ARTHROPLASTY ANTERIOR APPROACH (Right) ADDITIONAL DIAGNOSIS:  Expected Acute Blood Loss Anemia, Diabetes,HTN,  Patient's anticipated LOS is less than 2 midnights, meeting  these requirements: - Younger than 32 - Lives within 1 hour of care - Has a competent adult at home to recover with post-op recover - NO history of  - Chronic pain requiring opiods  - Diabetes  - Coronary Artery Disease  - Heart failure  - Heart attack  - Stroke  - DVT/VTE  - Cardiac arrhythmia  - Respiratory Failure/COPD  - Renal failure  - Anemia  - Advanced Liver disease     Plan: PT/OT WBAT, THA  DVT Prophylaxis: SCDx72 hrs, ASA 81 mg BID x 2 weeks  DISCHARGE PLAN: Home, when passes PT  DISCHARGE NEEDS: HHPT, Walker, and 3-in-1 comode seat PATIENT IDMacel Casey  MRN: [id number]  DOB/AGE:  06/04/1955 / 65 y.o.  1 Day Post-Op Procedure(s) (LRB): RIGHT TOTAL HIP ARTHROPLASTY ANTERIOR APPROACH (Right)

## 2020-10-05 NOTE — Progress Notes (Signed)
Physical Therapy Treatment Patient Details Name: Catlin Mckiver MRN: [id number] DOB: Apr 28, 1955 Today's Date: 10/05/2020    History of Present Illness Patient is 65 y.o. female s/p Rt THA anterior approach on 10/04/20 with PMH significant for OA, HTN, DM, depression.    PT Comments    POD # 1 am session Pt was OOB in recliner.  Pt required + 2 assist for amb.  General Gait Details: VERY limited amb distance due to pain and effort.  Pt unable to advance R LE so Therapist assisted "pushing" R foot forward during L weight shift.  Recliner following for safety + 2 assist. Also assisted with toilet transfer.  Pt has a tendancy to "scream" during sit to stand and stand to sit but then she appologizes.  required repeat VC's on proper hand placement and had difficulty transitioning hands from recliner to walker.   Pt will need another PT session.    Follow Up Recommendations  Follow surgeon's recommendation for DC plan and follow-up therapies     Equipment Recommendations  Rolling walker with 5" wheels;3in1 (PT)    Recommendations for Other Services       Precautions / Restrictions Precautions Precautions: Fall Restrictions Weight Bearing Restrictions: No Other Position/Activity Restrictions: WBAT    Mobility  Bed Mobility               General bed mobility comments: OOB in recliner    Transfers Overall transfer level: Needs assistance Equipment used: Rolling walker (2 wheeled) Transfers: Sit to/from Stand Sit to Stand: Min assist;Mod assist         General transfer comment: pt was OOB in recliner.  Did require increased assist from lower recliner level to rise.  Pt has a tendancy to "scream" during sit to stand and stand to sit but then she appologizes.  required repeat VC's on proper hand placement and had difficulty transitioning hands from recliner to walker.  Also assisted with a toilet transfer.  Ambulation/Gait Ambulation/Gait assistance: Min assist;Mod assist;+2  safety/equipment Gait Distance (Feet): 8 Feet Assistive device: Rolling walker (2 wheeled) Gait Pattern/deviations: Step-to pattern;Decreased stance time - right;Decreased step length - left;Decreased step length - right Gait velocity: decreased   General Gait Details: VERY limited amb distance due to pain and effort.  Pt unable to advance R LE so Therapist assisted "pushing" R foot forward during L weight shift.  Recliner following for safety + 2 assist.   Stairs             Wheelchair Mobility    Modified Rankin (Stroke Patients Only)       Balance                                            Cognition Arousal/Alertness: Awake/alert Behavior During Therapy: WFL for tasks assessed/performed Overall Cognitive Status: Within Functional Limits for tasks assessed                                 General Comments: AxO x 3 very pleasant      Exercises      General Comments        Pertinent Vitals/Pain Pain Assessment: 0-10 Pain Score: 8  Pain Location: Rt hip Pain Descriptors / Indicators: Aching;Discomfort;Crying;Grimacing;Guarding;Moaning Pain Intervention(s): Monitored during session;Premedicated before session;Repositioned;Ice applied    Home Living  Prior Function            PT Goals (current goals can now be found in the care plan section) Progress towards PT goals: Progressing toward goals    Frequency    7X/week      PT Plan Current plan remains appropriate    Co-evaluation              AM-PAC PT "6 Clicks" Mobility   Outcome Measure  Help needed turning from your back to your side while in a flat bed without using bedrails?: A Little Help needed moving from lying on your back to sitting on the side of a flat bed without using bedrails?: A Lot Help needed moving to and from a bed to a chair (including a wheelchair)?: A Lot Help needed standing up from a chair using your  arms (e.g., wheelchair or bedside chair)?: A Lot Help needed to walk in hospital room?: A Lot Help needed climbing 3-5 steps with a railing? : A Lot 6 Click Score: 13    End of Session Equipment Utilized During Treatment: Gait belt Activity Tolerance: Patient limited by fatigue;Patient limited by pain Patient left: in chair;with call bell/phone within reach Nurse Communication: Mobility status PT Visit Diagnosis: Muscle weakness (generalized) (M62.81);Difficulty in walking, not elsewhere classified (R26.2)     Time: [id number] PT Time Calculation (min) (ACUTE ONLY): 25 min  Charges:  $Gait Training: 8-22 mins $Therapeutic Activity: 8-22 mins                    Rica Koyanagi  PTA Acute  Rehabilitation Services Pager      [phone] Office      [phone]

## 2020-10-05 NOTE — Plan of Care (Signed)
  Problem: Activity: Goal: Risk for activity intolerance will decrease Outcome: Progressing   Problem: Pain Managment: Goal: General experience of comfort will improve Outcome: Progressing   Problem: Safety: Goal: Ability to remain free from injury will improve Outcome: Progressing   

## 2020-10-05 NOTE — Progress Notes (Signed)
Physical Therapy Treatment Patient Details Name: Deborah Casey MRN: [id number] DOB: 08/21/1955 Today's Date: 10/05/2020    History of Present Illness Patient is 65 y.o. female s/p Rt THA anterior approach on 10/04/20 with PMH significant for OA, HTN, DM, depression.    PT Comments    POD # 1 pm session Pt OOB in recliner requesting to use bathroom.  General transfer comment: pt required increased time and struggles to rise from lower recliner level.  Then she admitted she has a LIFT CHAIR at home that she stated "all I have to do is push a button". Assisted with toilet transfer in which pt required Mod Assist to rise plus grab bar.  General Gait Details: pt only tolerated amb to and from bathroom due to fatigue/effort. Pt was able to self advance R LE this session but with much effort.  Assisted back to bed due to complete exhaustion.  Pt has NOT met goals to safely D/C to home today.  Pt's elder sister was present during session.   Follow Up Recommendations  Follow surgeon's recommendation for DC plan and follow-up therapies     Equipment Recommendations  Rolling walker with 5" wheels;3in1 (PT)    Recommendations for Other Services       Precautions / Restrictions Precautions Precautions: Fall Restrictions Weight Bearing Restrictions: No Other Position/Activity Restrictions: WBAT    Mobility  Bed Mobility Overal bed mobility: Needs Assistance Bed Mobility: Sit to Supine       Sit to supine: Mod assist   General bed mobility comments: required Mod Assist to support B LE back onto bed.    Transfers Overall transfer level: Needs assistance Equipment used: Rolling walker (2 wheeled) Transfers: Sit to/from Stand Sit to Stand: Min assist;Mod assist         General transfer comment: pt required increased time and struggles to rise from lower recliner level.  Then she admitted she has a LIFT CHAIR at home that she stated "all I have to do is push a  button".  Ambulation/Gait Ambulation/Gait assistance: Min assist Gait Distance (Feet): 22 Feet (11 feet to and from bathroom only) Assistive device: Rolling walker (2 wheeled) Gait Pattern/deviations: Step-to pattern;Decreased stance time - right;Decreased step length - left;Decreased step length - right Gait velocity: decreased   General Gait Details: pt only tolerated amb to and from bathroom due to fatigue/effort. Pt was able to self advance R LE this session but with much effort.  Assisted back to bed due to complete exhaustion.   Stairs             Wheelchair Mobility    Modified Rankin (Stroke Patients Only)       Balance                                            Cognition Arousal/Alertness: Awake/alert Behavior During Therapy: WFL for tasks assessed/performed Overall Cognitive Status: Within Functional Limits for tasks assessed                                 General Comments: AxO x 3 very pleasant      Exercises      General Comments        Pertinent Vitals/Pain Pain Assessment: 0-10 Pain Score: 8  Pain Location: Rt hip Pain Descriptors / Indicators: Aching;Discomfort;Crying;Grimacing;Guarding;Moaning  Pain Intervention(s): Monitored during session;Premedicated before session;Repositioned;Ice applied    Home Living                      Prior Function            PT Goals (current goals can now be found in the care plan section) Progress towards PT goals: Progressing toward goals    Frequency    7X/week      PT Plan Current plan remains appropriate    Co-evaluation              AM-PAC PT "6 Clicks" Mobility   Outcome Measure  Help needed turning from your back to your side while in a flat bed without using bedrails?: A Little Help needed moving from lying on your back to sitting on the side of a flat bed without using bedrails?: A Lot Help needed moving to and from a bed to a chair  (including a wheelchair)?: A Lot Help needed standing up from a chair using your arms (e.g., wheelchair or bedside chair)?: A Lot Help needed to walk in hospital room?: A Lot Help needed climbing 3-5 steps with a railing? : A Lot 6 Click Score: 13    End of Session Equipment Utilized During Treatment: Gait belt Activity Tolerance: Patient limited by fatigue;Patient limited by pain Patient left: in chair;with call bell/phone within reach Nurse Communication: Mobility status PT Visit Diagnosis: Muscle weakness (generalized) (M62.81);Difficulty in walking, not elsewhere classified (R26.2)     Time: 8938-1017 PT Time Calculation (min) (ACUTE ONLY): 25 min  Charges:  $Gait Training: 8-22 mins $Therapeutic Activity: 8-22 mins                    Rica Koyanagi  PTA Acute  Rehabilitation Services Pager      [phone] Office      [phone]

## 2020-10-05 NOTE — Plan of Care (Signed)
  Problem: Nutrition: Goal: Adequate nutrition will be maintained Outcome: Progressing   Problem: Elimination: Goal: Will not experience complications related to urinary retention Outcome: Progressing   Problem: Pain Managment: Goal: General experience of comfort will improve Outcome: Progressing   

## 2020-10-05 NOTE — TOC Transition Note (Signed)
Transition of Care [facility]) - CM/SW Discharge Note  Patient Details  Name: Deborah Casey MRN: [id number] Date of Birth: 1955-07-02  Transition of Care [facility]) CM/SW Contact:  Sherie Don, LCSW Phone Number: 10/05/2020, 9:50 AM  Clinical Narrative: Patient is expected to discharge home after working with PT. CSW met with patient to confirm discharge plan and needs. Patient will discharge home with HHPT. Patient was initially referred to [facility], but the agency is not in-network with [facility] so [facility] referred the patient to [facility]. Patient has a toilet riser, but will need a rolling walker. [facility] to deliver rolling walker to patient's room. TOC signing off.  Final next level of care: [facility] Barriers to Discharge: No Barriers Identified  Patient Goals and CMS Choice Patient states their goals for this hospitalization and ongoing recovery are:: Discharge home with [place] CMS Medicare.gov Compare Post Acute Care list provided to:: Patient Choice offered to / list presented to : Patient  Discharge Plan and Services         DME Arranged: Walker rolling DME Agency: [facility] Representative spoke with at DME Agency: Pre-arranged in orthopedist's office HH Arranged: PT [place] Agency: [place] ([place]) Representative spoke with at [place]: Pre-arranged ([place] not in-network with [employer] Medicare, so [facility] made referral to [facility])  Readmission Risk Interventions No flowsheet data found.

## 2020-10-06 DIAGNOSIS — M1611 Unilateral primary osteoarthritis, right hip: Secondary | ICD-10-CM | POA: Diagnosis not present

## 2020-10-06 LAB — CBC
HCT: 32.5 % — ABNORMAL LOW (ref 36.0–46.0)
Hemoglobin: 10.7 g/dL — ABNORMAL LOW (ref 12.0–15.0)
MCH: 28.8 pg (ref 26.0–34.0)
MCHC: 32.9 g/dL (ref 30.0–36.0)
MCV: 87.4 fL (ref 80.0–100.0)
Platelets: 237 10*3/uL (ref 150–400)
RBC: 3.72 MIL/uL — ABNORMAL LOW (ref 3.87–5.11)
RDW: 14.4 % (ref 11.5–15.5)
WBC: 13.6 10*3/uL — ABNORMAL HIGH (ref 4.0–10.5)
nRBC: 0 % (ref 0.0–0.2)

## 2020-10-06 LAB — GLUCOSE, CAPILLARY: Glucose-Capillary: 123 mg/dL — ABNORMAL HIGH (ref 70–99)

## 2020-10-06 NOTE — Progress Notes (Signed)
Physical Therapy Treatment Patient Details Name: Deborah Casey MRN: [id number] DOB: 15-Feb-1955 Today's Date: 10/06/2020    History of Present Illness Patient is 65 y.o. female s/p Rt THA anterior approach on 10/04/20 with PMH significant for OA, HTN, DM, depression.    PT Comments    Pt is progressing toward acute PT goals this session with progression to stair training. Pt ambulated ~62f with MIN guard progressing to supervision for ~167f no LOB observed. Pt performed safe curb negotiation with MIN A provided for RW stability and cues for sequencing and safe hand placement. PT reviewed LE HEP, pt demonstrated understanding. Pt is currently at safe mobility level for d/c home. Pt will benefit from continued skilled PT to increase their independence and maximize safety with mobility.     Follow Up Recommendations  Follow surgeon's recommendation for DC plan and follow-up therapies     Equipment Recommendations  Rolling walker with 5" wheels;3in1 (PT)    Recommendations for Other Services       Precautions / Restrictions Precautions Precautions: Fall Restrictions Weight Bearing Restrictions: No RLE Weight Bearing: Weight bearing as tolerated    Mobility  Bed Mobility Overal bed mobility: Needs Assistance Bed Mobility: Sit to Supine     Supine to sit: Supervision;HOB elevated     General bed mobility comments: supervision for safety with increased time. Pt able to initiate movement of B LEs to EOB,  using Lt LE to hook Rt to complete prorgession to EOB.    Transfers Overall transfer level: Needs assistance Equipment used: Rolling walker (2 wheeled) Transfers: Sit to/from Stand Sit to Stand: Min guard;Supervision;From elevated surface         General transfer comment: x2 sit to stand; MIN guard for sit to stand pt demonstrating safe hand placement, bed height elevated to pt's reported bed height at home. Supervision provided for [facility] with use of B UEs on  armrests for power up.  Ambulation/Gait Ambulation/Gait assistance: Min guard;Supervision Gait Distance (Feet): 68 Feet Assistive device: Rolling walker (2 wheeled) Gait Pattern/deviations: Step-to pattern;Decreased stance time - right;Decreased step length - left;Decreased step length - right;Decreased weight shift to right (circumduction- Rt LE) Gait velocity: decreased   General Gait Details: Pt demonstrating gait deviations due to pain, which improved throughout session, no LOB observed. MIN guard provided for safety with cues for step to gait pattern sequencing with use of RW. Pt with difficulty progressing Rt LE initally "walking toes" with no foot clearance. Rt LE foot clearance and increased step length observed with cues to "bring toes to ceiling" for improved ankle DF to assist with foot clearance. PT edcuated pt on importance of foot clearance for fall prevention.   Stairs Stairs: Yes Stairs assistance: Min guard;Min assist Stair Management: No rails;Forwards;With walker Number of Stairs: 1 General stair comments: MIN guard provided for safety with stair negoitation and cues for sequencing "up with the good, down with the bad." MIN guard provided for safety, pt able to clear step with no physical assist and increased time. PT edcuated pt on safe guarding position for family members at home.   Wheelchair Mobility    Modified Rankin (Stroke Patients Only)       Balance Overall balance assessment: Needs assistance Sitting-balance support: Feet supported Sitting balance-Leahy Scale: Fair     Standing balance support: Bilateral upper extremity supported Standing balance-Leahy Scale: Poor Standing balance comment: use of external support  Cognition Arousal/Alertness: Awake/alert Behavior During Therapy: WFL for tasks assessed/performed Overall Cognitive Status: Within Functional Limits for tasks assessed                                         Exercises Total Joint Exercises Ankle Circles/Pumps: AROM;Both;20 reps;Supine Quad Sets: AROM;Both;10 reps;Supine Short Arc Quad: AROM;Right;10 reps;Supine Heel Slides: AAROM;Right;5 reps;Seated (cues ofr use of gait belt to assist)    General Comments General comments (skin integrity, edema, etc.): PT reviewed fall prevention strategies and steps to take in event of incidents at home. PT also discussed with pt benefits of HHPT in progressing to PLOF following d/c.      Pertinent Vitals/Pain Pain Assessment: 0-10 Pain Score: 5  Pain Location: Rt hip Pain Descriptors / Indicators: Aching;Discomfort;Grimacing;Guarding Pain Intervention(s): Limited activity within patient's tolerance;Monitored during session;Premedicated before session;Repositioned (pt deferring ice at this time, PT educated on benefits and discussed with pt to notify nursing staff when ice is needed)    Home Living                      Prior Function            PT Goals (current goals can now be found in the care plan section) Acute Rehab PT Goals Patient Stated Goal: stop hurting PT Goal Formulation: With patient Time For Goal Achievement: 10/11/20 Potential to Achieve Goals: Good Progress towards PT goals: Progressing toward goals    Frequency    7X/week      PT Plan Current plan remains appropriate    Co-evaluation              AM-PAC PT "6 Clicks" Mobility   Outcome Measure  Help needed turning from your back to your side while in a flat bed without using bedrails?: A Little Help needed moving from lying on your back to sitting on the side of a flat bed without using bedrails?: A Little Help needed moving to and from a bed to a chair (including a wheelchair)?: A Little Help needed standing up from a chair using your arms (e.g., wheelchair or bedside chair)?: A Little Help needed to walk in hospital room?: A Little Help needed climbing 3-5 steps with a  railing? : A Little 6 Click Score: 18    End of Session Equipment Utilized During Treatment: Gait belt Activity Tolerance: Patient tolerated treatment well Patient left: in chair;with call bell/phone within reach;with chair alarm set Nurse Communication: Mobility status PT Visit Diagnosis: Muscle weakness (generalized) (M62.81);Pain;Difficulty in walking, not elsewhere classified (R26.2) Pain - Right/Left: Right Pain - part of body: Hip     Time: [id number] PT Time Calculation (min) (ACUTE ONLY): 44 min  Charges:  $Gait Training: 8-22 mins $Therapeutic Activity: 23-37 mins                     Festus Barren PT, DPT  Acute Rehabilitation Services  Office [phone]   10/06/2020, 11:08 AM

## 2020-10-06 NOTE — Discharge Summary (Signed)
Patient ID: Deborah Casey MRN: [id number] DOB/AGE: [birth year] 65 y.o.  Admit date: 10/04/2020 Discharge date: 10/06/2020  Admission Diagnoses:  Principal Problem:   Osteoarthritis of right hip Active Problems:   S/P total right hip arthroplasty   Discharge Diagnoses:  Same  Past Medical History:  Diagnosis Date   Arthritis    Cataract    bilateral but not ready for surgery   Depression    DM (diabetes mellitus), type 2 ([place])    Hypertension     Surgeries: Procedure(s): RIGHT TOTAL HIP ARTHROPLASTY ANTERIOR APPROACH on 10/04/2020   Consultants:   Discharged Condition: Improved  Hospital Course: Deborah Casey is an 65 y.o. female who was admitted 10/04/2020 for operative treatment ofOsteoarthritis of right hip. Patient has severe unremitting pain that affects sleep, daily activities, and work/hobbies. After pre-op clearance the patient was taken to the operating room on 10/04/2020 and underwent  Procedure(s): RIGHT TOTAL HIP ARTHROPLASTY ANTERIOR APPROACH.    Patient was given perioperative antibiotics:  Anti-infectives (From admission, onward)    Start     Dose/Rate Route Frequency Ordered Stop   10/04/20 0730  ceFAZolin (ANCEF) IVPB 2g/100 mL premix        2 g 200 mL/hr over 30 Minutes Intravenous On call to O.R. 10/04/20 [id number] 10/04/20 0947        Patient was given sequential compression devices, early ambulation, and chemoprophylaxis to prevent DVT.  Patient benefited maximally from hospital stay and there were no complications.    Recent vital signs: Patient Vitals for the past 24 hrs:  BP Temp Pulse Resp SpO2  10/06/20 0428 129/68 98.4 F (36.9 C) 87 17 97 %  10/05/20 2135 140/90 98.2 F (36.8 C) (!) 109 17 99 %  10/05/20 1300 112/71 98.7 F (37.1 C) 69 18 94 %  10/05/20 0932 127/71 98.3 F (36.8 C) 74 16 99 %     Recent laboratory studies:  Recent Labs    10/05/20 0331  WBC 13.0*  HGB 11.0*  HCT 32.7*  PLT 158  NA 138  K 3.9  CL 105  CO2 22   BUN 11  CREATININE 0.70  GLUCOSE 171*  CALCIUM 8.4*     Discharge Medications:   Allergies as of 10/06/2020       Reactions   Ciprofloxacin Itching        Medication List     STOP taking these medications    acetaminophen 650 MG CR tablet Commonly known as: TYLENOL   ibuprofen 800 MG tablet Commonly known as: ADVIL       TAKE these medications    aspirin EC 81 MG tablet Take 1 tablet (81 mg total) by mouth 2 (two) times daily.   aspirin-sod bicarb-citric acid 325 MG Tbef tablet Commonly known as: ALKA-SELTZER Take 325-650 mg by mouth every 6 (six) hours as needed (indigestion).   CeraVe Crea Apply 1 application topically daily.   cetirizine 10 MG tablet Commonly known as: ZYRTEC Take 10 mg by mouth at bedtime as needed (itching).   cyanocobalamin 1000 MCG tablet Take 1,000 mcg by mouth daily.   D3-1000 25 MCG (1000 UT) tablet Generic drug: Cholecalciferol Take 1,000 Units by mouth daily.   doxylamine (Sleep) 25 MG tablet Commonly known as: UNISOM Take 25 mg by mouth at bedtime as needed for sleep.   Fish Oil 1000 MG Caps Take 1,000 mg by mouth daily.   gabapentin 100 MG capsule Commonly known as: NEURONTIN Take 1 capsule (100 mg total) by mouth  at bedtime.   glucose blood test strip FreeStyle Lite Strips   hydrocortisone 2.5 % cream Apply 1 application topically daily.   Janumet XR 50-1000 MG Tb24 Generic drug: SitaGLIPtin-MetFORMIN HCl Take 1 tablet by mouth in the morning and at bedtime.   Lactaid 3000 units tablet Generic drug: lactase Take 3,000-6,000 Units by mouth daily as needed (consuming dairy).   lisinopril-hydrochlorothiazide 20-12.5 MG tablet Commonly known as: ZESTORETIC Take 1 tablet by mouth daily.   omeprazole 40 MG capsule Commonly known as: PRILOSEC Take 40 mg by mouth daily as needed (acid reflux).   oxyCODONE-acetaminophen 5-325 MG tablet Commonly known as: PERCOCET/ROXICET Take 1 tablet by mouth every 4  (four) hours as needed for severe pain.   tiZANidine 2 MG tablet Commonly known as: ZANAFLEX Take 1 tablet (2 mg total) by mouth every 6 (six) hours as needed.   vitamin C 500 MG tablet Commonly known as: ASCORBIC ACID Take 500 mg by mouth daily.               Durable Medical Equipment  (From admission, onward)           Start     Ordered   10/04/20 1731  DME Walker rolling  Once       Question:  Patient needs a walker to treat with the following condition  Answer:  Status post right hip replacement   10/04/20 1730   10/04/20 1731  DME 3 n 1  Once        10/04/20 1730              Discharge Care Instructions  (From admission, onward)           Start     Ordered   10/06/20 0000  Weight bearing as tolerated        10/06/20 0815   10/04/20 0000  Weight bearing as tolerated        10/04/20 1203            Diagnostic Studies: DG Chest 2 View  Result Date: 09/24/2020 CLINICAL DATA:  Preop hip surgery EXAM: CHEST - 2 VIEW COMPARISON:  None. FINDINGS: The heart size and mediastinal contours are within normal limits. Both lungs are clear. The visualized skeletal structures are unremarkable. IMPRESSION: No active cardiopulmonary disease. Electronically Signed   By: Franchot Gallo M.D.   On: 09/24/2020 14:04   DG C-Arm 1-60 Min-No Report  Result Date: 10/04/2020 CLINICAL DATA:  Right hip replacement surgery EXAM: OPERATIVE RIGHT HIP (WITH PELVIS IF PERFORMED) 2 VIEWS TECHNIQUE: Fluoroscopic spot image(s) were submitted for interpretation post-operatively. COMPARISON:  02/12/2020 FINDINGS: Interval right hip arthroplasty. Components project in expected location. No fracture or dislocation. IMPRESSION: Right hip arthroplasty without apparent complication. Electronically Signed   By: Lucrezia Europe M.D.   On: 10/04/2020 13:33   DG HIP OPERATIVE UNILAT W OR W/O PELVIS RIGHT  Result Date: 10/04/2020 CLINICAL DATA:  Right hip replacement surgery EXAM: OPERATIVE RIGHT  HIP (WITH PELVIS IF PERFORMED) 2 VIEWS TECHNIQUE: Fluoroscopic spot image(s) were submitted for interpretation post-operatively. COMPARISON:  02/12/2020 FINDINGS: Interval right hip arthroplasty. Components project in expected location. No fracture or dislocation. IMPRESSION: Right hip arthroplasty without apparent complication. Electronically Signed   By: Lucrezia Europe M.D.   On: 10/04/2020 13:33    Disposition: Discharge disposition: 01-Home or Self Care       Discharge Instructions     Call MD / Call 911   Complete by: As directed  If you experience chest pain or shortness of breath, CALL 911 and be transported to the hospital emergency room.  If you develope a fever above 101 F, pus (white drainage) or increased drainage or redness at the wound, or calf pain, call your surgeon's office.   Call MD / Call 911   Complete by: As directed    If you experience chest pain or shortness of breath, CALL 911 and be transported to the hospital emergency room.  If you develope a fever above 101 F, pus (white drainage) or increased drainage or redness at the wound, or calf pain, call your surgeon's office.   Constipation Prevention   Complete by: As directed    Drink plenty of fluids.  Prune juice may be helpful.  You may use a stool softener, such as Colace (over the counter) 100 mg twice a day.  Use MiraLax (over the counter) for constipation as needed.   Constipation Prevention   Complete by: As directed    Drink plenty of fluids.  Prune juice may be helpful.  You may use a stool softener, such as Colace (over the counter) 100 mg twice a day.  Use MiraLax (over the counter) for constipation as needed.   Diet - low sodium heart healthy   Complete by: As directed    Driving restrictions   Complete by: As directed    No driving for 2 weeks   Driving restrictions   Complete by: As directed    No driving for 2 weeks   Increase activity slowly as tolerated   Complete by: As directed    Increase  activity slowly as tolerated   Complete by: As directed    Patient may shower   Complete by: As directed    You may shower without a dressing once there is no drainage.  Do not wash over the wound.  If drainage remains, cover wound with plastic wrap and then shower.   Patient may shower   Complete by: As directed    You may shower without a dressing once there is no drainage.  Do not wash over the wound.  If drainage remains, cover wound with plastic wrap and then shower.   Post-operative opioid taper instructions:   Complete by: As directed    POST-OPERATIVE OPIOID TAPER INSTRUCTIONS: It is important to wean off of your opioid medication as soon as possible. If you do not need pain medication after your surgery it is ok to stop day one. Opioids include: Codeine, Hydrocodone(Norco, Vicodin), Oxycodone(Percocet, oxycontin) and hydromorphone amongst others.  Long term and even short term use of opiods can cause: Increased pain response Dependence Constipation Depression Respiratory depression And more.  Withdrawal symptoms can include Flu like symptoms Nausea, vomiting And more Techniques to manage these symptoms Hydrate well Eat regular healthy meals Stay active Use relaxation techniques(deep breathing, meditating, yoga) Do Not substitute Alcohol to help with tapering If you have been on opioids for less than two weeks and do not have pain than it is ok to stop all together.  Plan to wean off of opioids This plan should start within one week post op of your joint replacement. Maintain the same interval or time between taking each dose and first decrease the dose.  Cut the total daily intake of opioids by one tablet each day Next start to increase the time between doses. The last dose that should be eliminated is the evening dose.      Post-operative opioid taper instructions:  Complete by: As directed    POST-OPERATIVE OPIOID TAPER INSTRUCTIONS: It is important to wean off  of your opioid medication as soon as possible. If you do not need pain medication after your surgery it is ok to stop day one. Opioids include: Codeine, Hydrocodone(Norco, Vicodin), Oxycodone(Percocet, oxycontin) and hydromorphone amongst others.  Long term and even short term use of opiods can cause: Increased pain response Dependence Constipation Depression Respiratory depression And more.  Withdrawal symptoms can include Flu like symptoms Nausea, vomiting And more Techniques to manage these symptoms Hydrate well Eat regular healthy meals Stay active Use relaxation techniques(deep breathing, meditating, yoga) Do Not substitute Alcohol to help with tapering If you have been on opioids for less than two weeks and do not have pain than it is ok to stop all together.  Plan to wean off of opioids This plan should start within one week post op of your joint replacement. Maintain the same interval or time between taking each dose and first decrease the dose.  Cut the total daily intake of opioids by one tablet each day Next start to increase the time between doses. The last dose that should be eliminated is the evening dose.      Weight bearing as tolerated   Complete by: As directed    Weight bearing as tolerated   Complete by: As directed         Follow-up Information     Frederik Pear, MD Follow up in 2 week(s).   Specialty: Orthopedic Surgery Contact information: [place] [id number] [phone]         [facility] Follow [facility].   Specialty: Home Health Services Why: PT                 Signed: Joanell Rising 10/06/2020, 8:16 AM

## 2020-10-06 NOTE — Progress Notes (Signed)
RN reviewed discharge instructions with patient and family. All questions answered.   Paperwork given. Prescriptions electronically sent to patient pharmacy.    NT rolled patient down with all belongings to family car.     Shyna Duignan, RN  

## 2020-10-06 NOTE — Progress Notes (Signed)
PATIENT ID: Deborah Casey  MRN: [id number]  DOB/AGE:  1956-01-18 / 65 y.o.  2 Days Post-Op Procedure(s) (LRB): RIGHT TOTAL HIP ARTHROPLASTY ANTERIOR APPROACH (Right)    PROGRESS NOTE Subjective: Patient is alert, oriented, no Nausea, no Vomiting, yes passing gas, . Taking PO well. Denies SOB, Chest or Calf Pain. Using Incentive Spirometer, PAS in place. Ambulate Wbat with pt walking 22 ft with therapy Patient reports pain as  moderate .    Objective: Vital signs in last 24 hours: Vitals:   10/05/20 0932 10/05/20 1300 10/05/20 2135 10/06/20 0428  BP: 127/71 112/71 140/90 129/68  Pulse: 74 69 (!) 109 87  Resp: '16 18 17 17  '$ Temp: 98.3 F (36.8 C) 98.7 F (37.1 C) 98.2 F (36.8 C) 98.4 F (36.9 C)  TempSrc:      SpO2: 99% 94% 99% 97%  Weight:      Height:          Intake/Output from previous day: I/O last 3 completed shifts: In: 4864.6 [P.O.:2220; I.V.:2644.6] Out: 2300 [Urine:2300]   Intake/Output this shift: No intake/output data recorded.   LABORATORY DATA: Recent Labs    10/05/20 0331 10/05/20 0810 10/05/20 1642 10/05/20 2137 10/06/20 0728  WBC 13.0*  --   --   --   --   HGB 11.0*  --   --   --   --   HCT 32.7*  --   --   --   --   PLT 158  --   --   --   --   NA 138  --   --   --   --   K 3.9  --   --   --   --   CL 105  --   --   --   --   CO2 22  --   --   --   --   BUN 11  --   --   --   --   CREATININE 0.70  --   --   --   --   GLUCOSE 171*  --   --   --   --   GLUCAP  --    < > 154* 144* 123*  CALCIUM 8.4*  --   --   --   --    < > = values in this interval not displayed.    Examination: Neurologically intact Neurovascular intact Sensation intact distally Intact pulses distally Dorsiflexion/Plantar flexion intact Incision: dressing C/D/I No cellulitis present Compartment soft} XR AP&Lat of hip shows well placed\fixed THA  Assessment:   2 Days Post-Op Procedure(s) (LRB): RIGHT TOTAL HIP ARTHROPLASTY ANTERIOR APPROACH (Right) ADDITIONAL  DIAGNOSIS:  Expected Acute Blood Loss Anemia, Diabetes and Hypertension Anticipated LOS equal to or greater than 2 midnights due to - Age 55 and older with one or more of the following:  - Obesity  - Expected need for hospital services (PT, OT, Nursing) required for safe  discharge  - Anticipated need for postoperative skilled nursing care or inpatient rehab  - Active co-morbidities: Diabetes OR   - Unanticipated findings during/Post Surgery: Slow post-op progression: GI, pain control, mobility   Plan: PT/OT WBAT, THA  DVT Prophylaxis: SCDx72 hrs, ASA 81 mg BID x 2 weeks  DISCHARGE PLAN: Home  DISCHARGE NEEDS: HHPT, Walker, and 3-in-1 comode seat

## 2020-10-11 ENCOUNTER — Ambulatory Visit: Payer: Federal, State, Local not specified - PPO | Admitting: Neurology

## 2020-11-05 ENCOUNTER — Other Ambulatory Visit: Payer: Self-pay | Admitting: Neurology

## 2020-11-18 ENCOUNTER — Ambulatory Visit: Payer: Federal, State, Local not specified - PPO

## 2020-12-15 ENCOUNTER — Other Ambulatory Visit: Payer: Self-pay

## 2020-12-15 ENCOUNTER — Ambulatory Visit
Admission: RE | Admit: 2020-12-15 | Discharge: 2020-12-15 | Disposition: A | Discharge status: Home / Self Care | Payer: Medicare (Managed Care) | Source: Ambulatory Visit | Attending: Internal Medicine | Admitting: Internal Medicine

## 2020-12-15 DIAGNOSIS — Z1231 Encounter for screening mammogram for malignant neoplasm of breast: Secondary | ICD-10-CM

## 2020-12-17 ENCOUNTER — Other Ambulatory Visit: Payer: Self-pay | Admitting: Internal Medicine

## 2020-12-17 DIAGNOSIS — R928 Other abnormal and inconclusive findings on diagnostic imaging of breast: Secondary | ICD-10-CM

## 2021-01-12 ENCOUNTER — Ambulatory Visit
Admission: RE | Admit: 2021-01-12 | Discharge: 2021-01-12 | Disposition: A | Discharge status: Home / Self Care | Payer: Medicare (Managed Care) | Source: Ambulatory Visit | Attending: Internal Medicine | Admitting: Internal Medicine

## 2021-01-12 ENCOUNTER — Other Ambulatory Visit: Payer: Self-pay | Admitting: Internal Medicine

## 2021-01-12 DIAGNOSIS — R928 Other abnormal and inconclusive findings on diagnostic imaging of breast: Secondary | ICD-10-CM

## 2021-01-12 DIAGNOSIS — N6002 Solitary cyst of left breast: Secondary | ICD-10-CM

## 2021-04-18 ENCOUNTER — Ambulatory Visit (INDEPENDENT_AMBULATORY_CARE_PROVIDER_SITE_OTHER): Payer: Medicare HMO | Admitting: Neurology

## 2021-04-18 ENCOUNTER — Other Ambulatory Visit: Payer: Self-pay

## 2021-04-18 ENCOUNTER — Encounter: Payer: Self-pay | Admitting: Neurology

## 2021-04-18 VITALS — BP 113/76 | HR 77 | Ht 63.0 in | Wt 212.0 lb

## 2021-04-18 DIAGNOSIS — M79672 Pain in left foot: Secondary | ICD-10-CM | POA: Diagnosis not present

## 2021-04-18 DIAGNOSIS — R202 Paresthesia of skin: Secondary | ICD-10-CM

## 2021-04-18 NOTE — Patient Instructions (Signed)
Nerve testing of the hands  ELECTROMYOGRAM AND NERVE CONDUCTION STUDIES (EMG/NCS) INSTRUCTIONS  How to Prepare The neurologist conducting the EMG will need to know if you have certain medical conditions. Tell the neurologist and other EMG lab personnel if you: Have a pacemaker or any other electrical medical device Take blood-thinning medications Have hemophilia, a blood-clotting disorder that causes prolonged bleeding Bathing Take a shower or bath shortly before your exam in order to remove oils from your skin. Don't apply lotions or creams before the exam.  What to Expect You'll likely be asked to change into a hospital gown for the procedure and lie down on an examination table. The following explanations can help you understand what will happen during the exam.  Electrodes. The neurologist or a technician places surface electrodes at various locations on your skin depending on where you're experiencing symptoms. Or the neurologist may insert needle electrodes at different sites depending on your symptoms.  Sensations. The electrodes will at times transmit a tiny electrical current that you may feel as a twinge or spasm. The needle electrode may cause discomfort or pain that usually ends shortly after the needle is removed. If you are concerned about discomfort or pain, you may want to talk to the neurologist about taking a short break during the exam.  Instructions. During the needle EMG, the neurologist will assess whether there is any spontaneous electrical activity when the muscle is at rest - activity that isn't present in healthy muscle tissue - and the degree of activity when you slightly contract the muscle.  He or she will give you instructions on resting and contracting a muscle at appropriate times. Depending on what muscles and nerves the neurologist is examining, he or she may ask you to change positions during the exam.  After your EMG You may experience some temporary, minor  bruising where the needle electrode was inserted into your muscle. This bruising should fade within several days. If it persists, contact your primary care doctor.   

## 2021-04-18 NOTE — Progress Notes (Signed)
? ? ?Follow-up Visit ? ? ?Date: 04/18/21 ? ? ?Deborah Casey ?MRN: [id number] ?DOB: 1956-01-08 ? ? ?Interim History: ?Deborah Casey is a 66 y.o. right-handed female with diabetes mellitus, GERD, hypertension, and depression returning to the clinic for follow-up of RLS.  The patient was accompanied to the clinic by self. ? ?History of present illness: ?Starting in early 2022, she began having intermittent pins and needles in the feet.  She was evaluated at [facility] and had normal NCS/EMG of the legs.  She was started on gabapentin '100mg'$  at bedtime.  Symptoms have significantly improved and occurs about once per week, lasting about a minute.  She continues to have nights of wanting to rub her feet together to get relief.  She does not have to walk.   ? ?UPDATE 04/18/2021:  Soon after her last visit, she began having worsening leg paresthesias, so went back to taking gabapentin.  She also complains of tingling over the right thigh, occurring every other day lasting about 15 seconds.  Denies weight gain or wearing tight constrictive clothing.  ?She has tingling in the hands, worse in the right hand, and noticeable in the morning.  It generally returns back to baseline within 5-10 minutes.  She is taking gabapentin 100-'300mg'$ /d, but has concerns over side effect profile. ?She also has a localized area over the top of her left foot which aches when she walks.  No swelling.  ? ? ?Medications:  ?Current Outpatient Medications on File Prior to Visit  ?Medication Sig Dispense Refill  ? aspirin-sod bicarb-citric acid (ALKA-SELTZER) 325 MG TBEF tablet Take 325-650 mg by mouth every 6 (six) hours as needed (indigestion).    ? cetirizine (ZYRTEC) 10 MG tablet Take 10 mg by mouth at bedtime as needed (itching).    ? Cholecalciferol (D3-1000) 25 MCG (1000 UT) tablet Take 1,000 Units by mouth daily.    ? Emollient (CERAVE) CREA Apply 1 application topically daily.    ? gabapentin (NEURONTIN) 100 MG capsule TAKE 1 CAPSULE(100  MG) BY MOUTH AT BEDTIME 30 capsule 0  ? hydrocortisone 2.5 % cream Apply 1 application topically daily.    ? JANUMET XR 50-1000 MG TB24 Take 1 tablet by mouth in the morning and at bedtime.    ? lactase (LACTAID) 3000 units tablet Take 3,000-6,000 Units by mouth daily as needed (consuming dairy).    ? lisinopril-hydrochlorothiazide (PRINZIDE,ZESTORETIC) 20-12.5 MG tablet Take 1 tablet by mouth daily.    ? Omega-3 Fatty Acids (FISH OIL) 1000 MG CAPS Take 1,000 mg by mouth daily.    ? omeprazole (PRILOSEC) 40 MG capsule Take 40 mg by mouth daily as needed (acid reflux).    ? vitamin C (ASCORBIC ACID) 500 MG tablet Take 500 mg by mouth daily.    ? aspirin EC 81 MG tablet Take 1 tablet (81 mg total) by mouth 2 (two) times daily. (Patient not taking: Reported on 04/18/2021) 60 tablet 0  ? cyanocobalamin 1000 MCG tablet Take 1,000 mcg by mouth daily. (Patient not taking: Reported on 04/18/2021)    ? doxylamine, Sleep, (UNISOM) 25 MG tablet Take 25 mg by mouth at bedtime as needed for sleep. (Patient not taking: Reported on 04/18/2021)    ? glucose blood test strip FreeStyle Lite Strips (Patient not taking: Reported on 09/10/2020)    ? oxyCODONE-acetaminophen (PERCOCET/ROXICET) 5-325 MG tablet Take 1 tablet by mouth every 4 (four) hours as needed for severe pain. (Patient not taking: Reported on 04/18/2021) 30 tablet 0  ? tiZANidine (  ZANAFLEX) 2 MG tablet Take 1 tablet (2 mg total) by mouth every 6 (six) hours as needed. (Patient not taking: Reported on 04/18/2021) 60 tablet 0  ? ?No current facility-administered medications on file prior to visit.  ? ? ?Allergies:  ?Allergies  ?Allergen Reactions  ? Ciprofloxacin Itching  ? ? ?Vital Signs:  ?BP 113/76   Pulse 77   Ht '5\' 3"'$  (1.6 m)   Wt 212 lb (96.2 kg)   SpO2 97%   BMI 37.55 kg/m?  ? ?Neurological Exam: ?MENTAL STATUS including orientation to time, place, person, recent and remote memory, attention span and concentration, language, and fund of knowledge is normal.  Speech is  not dysarthric. ? ?CRANIAL NERVES:  No visual field defects.  Pupils equal round and reactive to light.  Normal conjugate, extra-ocular eye movements in all directions of gaze.  No ptosis.  Face is symmetric. Palate elevates symmetrically.  Tongue is midline. ? ?MOTOR:  Motor strength is 5/5 in all extremities.  No atrophy, fasciculations or abnormal movements.  No pronator drift.  Tone is normal.   ? ?MSRs:  Reflexes are 2+/4 throughout . ? ?SENSORY:  Intact to vibration and temperature throughout. ? ?COORDINATION/GAIT:  Normal finger-to- nose-finger.  Intact rapid alternating movements bilaterally.  Gait narrow based and stable.  ? ?Data:n/a ? 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  No evidence of neuropathy on EMG ?- OK to continue gabapentin 100-'300mg'$  daily ?- Consider Cymbalta, if pt wants to switch to alternative therapy ?Left foot pain, localized over the dorsum of the foot, most likely musculoskeletal ?- Refer to podiatry ? ?Further recommendations pending results. ? ? ?Thank you for allowing me to participate in patient's care.  If I can answer any additional questions, I would be pleased to do so.   ? ?Sincerely, ? ? ? ?Debrina Kizer K. Posey Pronto, DO ? ? ?

## 2021-05-03 ENCOUNTER — Ambulatory Visit (INDEPENDENT_AMBULATORY_CARE_PROVIDER_SITE_OTHER): Payer: Medicare HMO | Admitting: Podiatry

## 2021-05-03 ENCOUNTER — Other Ambulatory Visit: Payer: Self-pay

## 2021-05-03 DIAGNOSIS — M19079 Primary osteoarthritis, unspecified ankle and foot: Secondary | ICD-10-CM | POA: Diagnosis not present

## 2021-05-03 MED ORDER — DICLOFENAC SODIUM 1 % EX GEL: 4.0000 g | Freq: Four times a day (QID) | CUTANEOUS | 4 refills | Status: AC

## 2021-05-06 ENCOUNTER — Encounter: Payer: Self-pay | Admitting: Podiatry

## 2021-05-06 NOTE — Progress Notes (Signed)
?  Subjective:  ?Patient ID: Johnica Armwood, female    DOB: 07/25/1955,  MRN: [id number] ? ?Chief Complaint  ?Patient presents with  ? Foot Pain  ?  NP  Left foot pain - top of foot  ? ? ?66 y.o. female presents with the above complaint. History confirmed with patient.  ? ?Objective:  ?Physical Exam: ?warm, good capillary refill, no trophic changes or ulcerative lesions, normal DP and PT pulses, normal sensory exam, and she has pain in the dorsal left foot in the midfoot across the tarsometatarsal joints, there is an area of palpable spurring here ?Assessment:  ? ?1. Arthritis of midfoot   ? ? ? ?Plan:  ?Patient was evaluated and treated and all questions answered. ? ?Discussed with her this is likely midfoot arthritis.  I recommended treatment with anti-inflammatories both topically with Voltaren gel and OTC NSAIDs as needed.  Reviewed treatment options including surgical and nonsurgical treatment.  If it is worsening could consider injection in the area although this is difficult.  Discussed with her that surgical treatment for Maitri left neuritis could be extensive and a lengthy recovery but outcomes are fairly successful.  I recommended a stiff soled supportive shoe.  She will see me as needed. ? ?Return if symptoms worsen or fail to improve.  ? ?

## 2021-05-17 ENCOUNTER — Ambulatory Visit (INDEPENDENT_AMBULATORY_CARE_PROVIDER_SITE_OTHER): Payer: Medicare HMO | Admitting: Neurology

## 2021-05-17 DIAGNOSIS — R202 Paresthesia of skin: Secondary | ICD-10-CM

## 2021-05-17 DIAGNOSIS — G5601 Carpal tunnel syndrome, right upper limb: Secondary | ICD-10-CM

## 2021-05-17 NOTE — Procedures (Signed)
[facility]  ?[address], [address] ? [place], [place] [address] ?Tel: [phone] ?Fax:  [phone] ?Test Date:  05/17/2021 ? ?Patient: Deborah Casey DOB: 02-19-1955 Physician: Narda Amber, DO  ?Sex: Female Height: '5\' 3"'$  Ref Phys: Narda Amber, DO  ?ID#: [id number]   Technician:   ? ?Patient Complaints: ?This is a 66 year old female referred for evaluation of bilateral hand paresthesias. ? ?NCV & EMG Findings: ?Extensive electrodiagnostic testing of the right upper extremity and additional studies of the left shows:  ?Right mixed palmar sensory responses show prolonged latency.  Left mixed palmar, bilateral median, and bilateral ulnar sensory responses are within normal limits. ?Bilateral median and ulnar motor responses are within normal limits. ?There is no evidence of active or chronic motor axonal loss changes affecting any of the tested muscles.  Motor unit configuration and recruitment pattern is within normal limits. ? ?Impression: ?Right median neuropathy at or distal to the wrist, consistent with a clinical diagnosis of carpal tunnel syndrome.  Overall, these findings are very mild in degree electrically. ?There is no evidence of left carpal tunnel syndrome or a cervical radiculopathy affecting either upper extremity. ? ? ?___________________________ ?Narda Amber, DO ? ? ? ?Nerve Conduction Studies ?Anti Sensory Summary Table ? ? Stim Site NR Peak (ms) Norm Peak (ms) P-T Amp (?V) Norm P-T Amp  ?Left Median Anti Sensory (2nd Digit)  32?C  ?Wrist    3.0 <3.8 26.4 >10  ?Right Median Anti Sensory (2nd Digit)  32?C  ?Wrist    2.8 <3.8 27.3 >10  ?Left Ulnar Anti Sensory (5th Digit)  32?C  ?Wrist    2.7 <3.2 23.0 >5  ?Right Ulnar Anti Sensory (5th Digit)  32?C  ?Wrist    2.6 <3.2 31.6 >5  ? ?Motor Summary Table ? ? Stim Site NR Onset (ms) Norm Onset (ms) O-P Amp (mV) Norm O-P Amp Site1 Site2 Delta-0 (ms) Dist (cm) Vel (m/s) Norm Vel (m/s)  ?Left Median Motor (Abd Poll Brev)  32?C  ?Wrist    3.4  <4.0 7.5 >5 Elbow Wrist 4.6 29.0 63 >50  ?Elbow    8.0  6.8         ?Right Median Motor (Abd Poll Brev)  32?C  ?Wrist    3.0 <4.0 11.0 >5 Elbow Wrist 4.6 29.0 63 >50  ?Elbow    7.6  9.9         ?Left Ulnar Motor (Abd Dig Minimi)  32?C  ?Wrist    2.2 <3.1 8.6 >7 B Elbow Wrist 3.6 23.0 64 >50  ?B Elbow    5.8  7.8  A Elbow B Elbow 1.6 10.0 62 >50  ?A Elbow    7.4  7.7         ?Right Ulnar Motor (Abd Dig Minimi)  32?C  ?Wrist    2.7 <3.1 8.7 >7 B Elbow Wrist 3.7 23.0 62 >50  ?B Elbow    6.4  8.1  A Elbow B Elbow 1.6 10.0 63 >50  ?A Elbow    8.0  7.7         ? ?Comparison Summary Table ? ? Stim Site NR Peak (ms) Norm Peak (ms) P-T Amp (?V) Site1 Site2 Delta-P (ms) Norm Delta (ms)  ?Left Median/Ulnar Palm Comparison (Wrist - 8cm)  32?C  ?Median Palm    1.9 <2.2 59.1 Median Palm Ulnar Palm 0.1   ?Ulnar Palm    1.8 <2.2 12.8      ?Right Median/Ulnar Palm Comparison (Wrist -  8cm)  32?C  ?Median Palm    2.0 <2.2 55.2 Median Palm Ulnar Palm 0.5   ?Ulnar Palm    1.5 <2.2 17.6      ? ?EMG ? ? Side Muscle Ins Act Fibs Psw Fasc Number Recrt Dur Dur. Amp Amp. Poly Poly. Comment  ?Right 1stDorInt Nml Nml Nml Nml Nml Nml Nml Nml Nml Nml Nml Nml N/A  ?Right PronatorTeres Nml Nml Nml Nml Nml Nml Nml Nml Nml Nml Nml Nml N/A  ?Right Biceps Nml Nml Nml Nml Nml Nml Nml Nml Nml Nml Nml Nml N/A  ?Right Triceps Nml Nml Nml Nml Nml Nml Nml Nml Nml Nml Nml Nml N/A  ?Right Deltoid Nml Nml Nml Nml Nml Nml Nml Nml Nml Nml Nml Nml N/A  ?Left 1stDorInt Nml Nml Nml Nml Nml Nml Nml Nml Nml Nml Nml Nml N/A  ?Left PronatorTeres Nml Nml Nml Nml Nml Nml Nml Nml Nml Nml Nml Nml N/A  ?Left Biceps Nml Nml Nml Nml Nml Nml Nml Nml Nml Nml Nml Nml N/A  ?Left Triceps Nml Nml Nml Nml Nml Nml Nml Nml Nml Nml Nml Nml N/A  ?Left Deltoid Nml Nml Nml Nml Nml Nml Nml Nml Nml Nml Nml Nml N/A  ? ? ? ? ?Waveforms: ?    ? ?    ? ?    ? ?  ? ? ?

## 2021-07-14 ENCOUNTER — Other Ambulatory Visit: Payer: Self-pay | Admitting: Family Medicine

## 2021-07-14 ENCOUNTER — Other Ambulatory Visit: Payer: Medicare (Managed Care)

## 2021-07-14 DIAGNOSIS — N6002 Solitary cyst of left breast: Secondary | ICD-10-CM

## 2021-07-23 ENCOUNTER — Ambulatory Visit
Admission: RE | Admit: 2021-07-23 | Discharge: 2021-07-23 | Disposition: A | Discharge status: Home / Self Care | Payer: Medicare HMO | Source: Ambulatory Visit | Attending: Family Medicine | Admitting: Family Medicine

## 2021-07-23 ENCOUNTER — Ambulatory Visit
Admission: RE | Admit: 2021-07-23 | Discharge: 2021-07-23 | Disposition: A | Discharge status: Home / Self Care | Payer: Medicare (Managed Care) | Source: Ambulatory Visit | Attending: Family Medicine | Admitting: Family Medicine

## 2021-07-23 DIAGNOSIS — N6002 Solitary cyst of left breast: Secondary | ICD-10-CM

## 2021-07-28 ENCOUNTER — Other Ambulatory Visit: Payer: Medicare (Managed Care)

## 2021-10-21 ENCOUNTER — Other Ambulatory Visit: Payer: Self-pay | Admitting: Family Medicine

## 2021-10-21 DIAGNOSIS — N6320 Unspecified lump in the left breast, unspecified quadrant: Secondary | ICD-10-CM

## 2021-10-27 ENCOUNTER — Ambulatory Visit (INDEPENDENT_AMBULATORY_CARE_PROVIDER_SITE_OTHER): Payer: Medicare HMO | Admitting: Podiatry

## 2021-10-27 ENCOUNTER — Ambulatory Visit (INDEPENDENT_AMBULATORY_CARE_PROVIDER_SITE_OTHER): Payer: Medicare HMO

## 2021-10-27 ENCOUNTER — Ambulatory Visit: Payer: Federal, State, Local not specified - PPO

## 2021-10-27 DIAGNOSIS — M778 Other enthesopathies, not elsewhere classified: Secondary | ICD-10-CM

## 2021-10-27 DIAGNOSIS — M7661 Achilles tendinitis, right leg: Secondary | ICD-10-CM | POA: Diagnosis not present

## 2021-10-27 DIAGNOSIS — M19071 Primary osteoarthritis, right ankle and foot: Secondary | ICD-10-CM

## 2021-10-27 DIAGNOSIS — M19079 Primary osteoarthritis, unspecified ankle and foot: Secondary | ICD-10-CM

## 2021-10-27 MED ORDER — TRIAMCINOLONE ACETONIDE 10 MG/ML IJ SUSP
10.0000 mg | Freq: Once | INTRAMUSCULAR | Status: AC
  Administered 2021-10-27: 10 mg

## 2021-10-27 MED ORDER — MELOXICAM 15 MG PO TABS: 15.0000 mg | ORAL_TABLET | Freq: Every day | ORAL | 2 refills | Status: DC

## 2021-10-28 LAB — GLUCOSE, POCT (MANUAL RESULT ENTRY): POC Glucose: 113 mg/dl — AB (ref 70–99)

## 2021-10-28 NOTE — Progress Notes (Signed)
Subjective:   Patient ID: Deborah Casey, female   DOB: 66 y.o.   MRN: [id number]   HPI Patient presents stating she has developed pain on the back of her right ankle and states that she had a steroid and an anti-inflammatory which is helped but it still sore and she has pain on top of her left foot that has remained and continues to give her inflammation   ROS      Objective:  Physical Exam  Neurovascular status intact with Achilles tendon right at insertion moderately inflamed improved from previous with diminished erythema edema and calor.  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  For the left I did discuss injection and risk and I did sterile prep and injected the left extensor complex 3 mg dexamethasone Kenalog 5 mg Xylocaine instructed on oral steroid therapy ice therapy and reappoint as

## 2021-11-02 ENCOUNTER — Ambulatory Visit: Payer: Federal, State, Local not specified - PPO | Admitting: Podiatry

## 2021-12-20 ENCOUNTER — Ambulatory Visit
Admission: RE | Admit: 2021-12-20 | Discharge: 2021-12-20 | Disposition: A | Discharge status: Home / Self Care | Payer: Medicare HMO | Source: Ambulatory Visit | Attending: Family Medicine | Admitting: Family Medicine

## 2021-12-20 ENCOUNTER — Ambulatory Visit
Admission: RE | Admit: 2021-12-20 | Discharge: 2021-12-20 | Disposition: A | Discharge status: Home / Self Care | Payer: Federal, State, Local not specified - PPO | Source: Ambulatory Visit | Attending: Family Medicine | Admitting: Family Medicine

## 2021-12-20 DIAGNOSIS — N6320 Unspecified lump in the left breast, unspecified quadrant: Secondary | ICD-10-CM

## 2022-01-24 ENCOUNTER — Other Ambulatory Visit: Payer: Federal, State, Local not specified - PPO

## 2022-06-06 ENCOUNTER — Other Ambulatory Visit: Payer: Self-pay | Admitting: Geriatric Medicine

## 2022-06-06 DIAGNOSIS — Z13820 Encounter for screening for osteoporosis: Secondary | ICD-10-CM

## 2022-08-01 IMAGING — DX DG CHEST 2V
2 series · 2 of 2 positions shown · non-contrast
Comparison: None.

CLINICAL DATA: Preop hip surgery

EXAM:
CHEST - 2 VIEW

[chest pa]
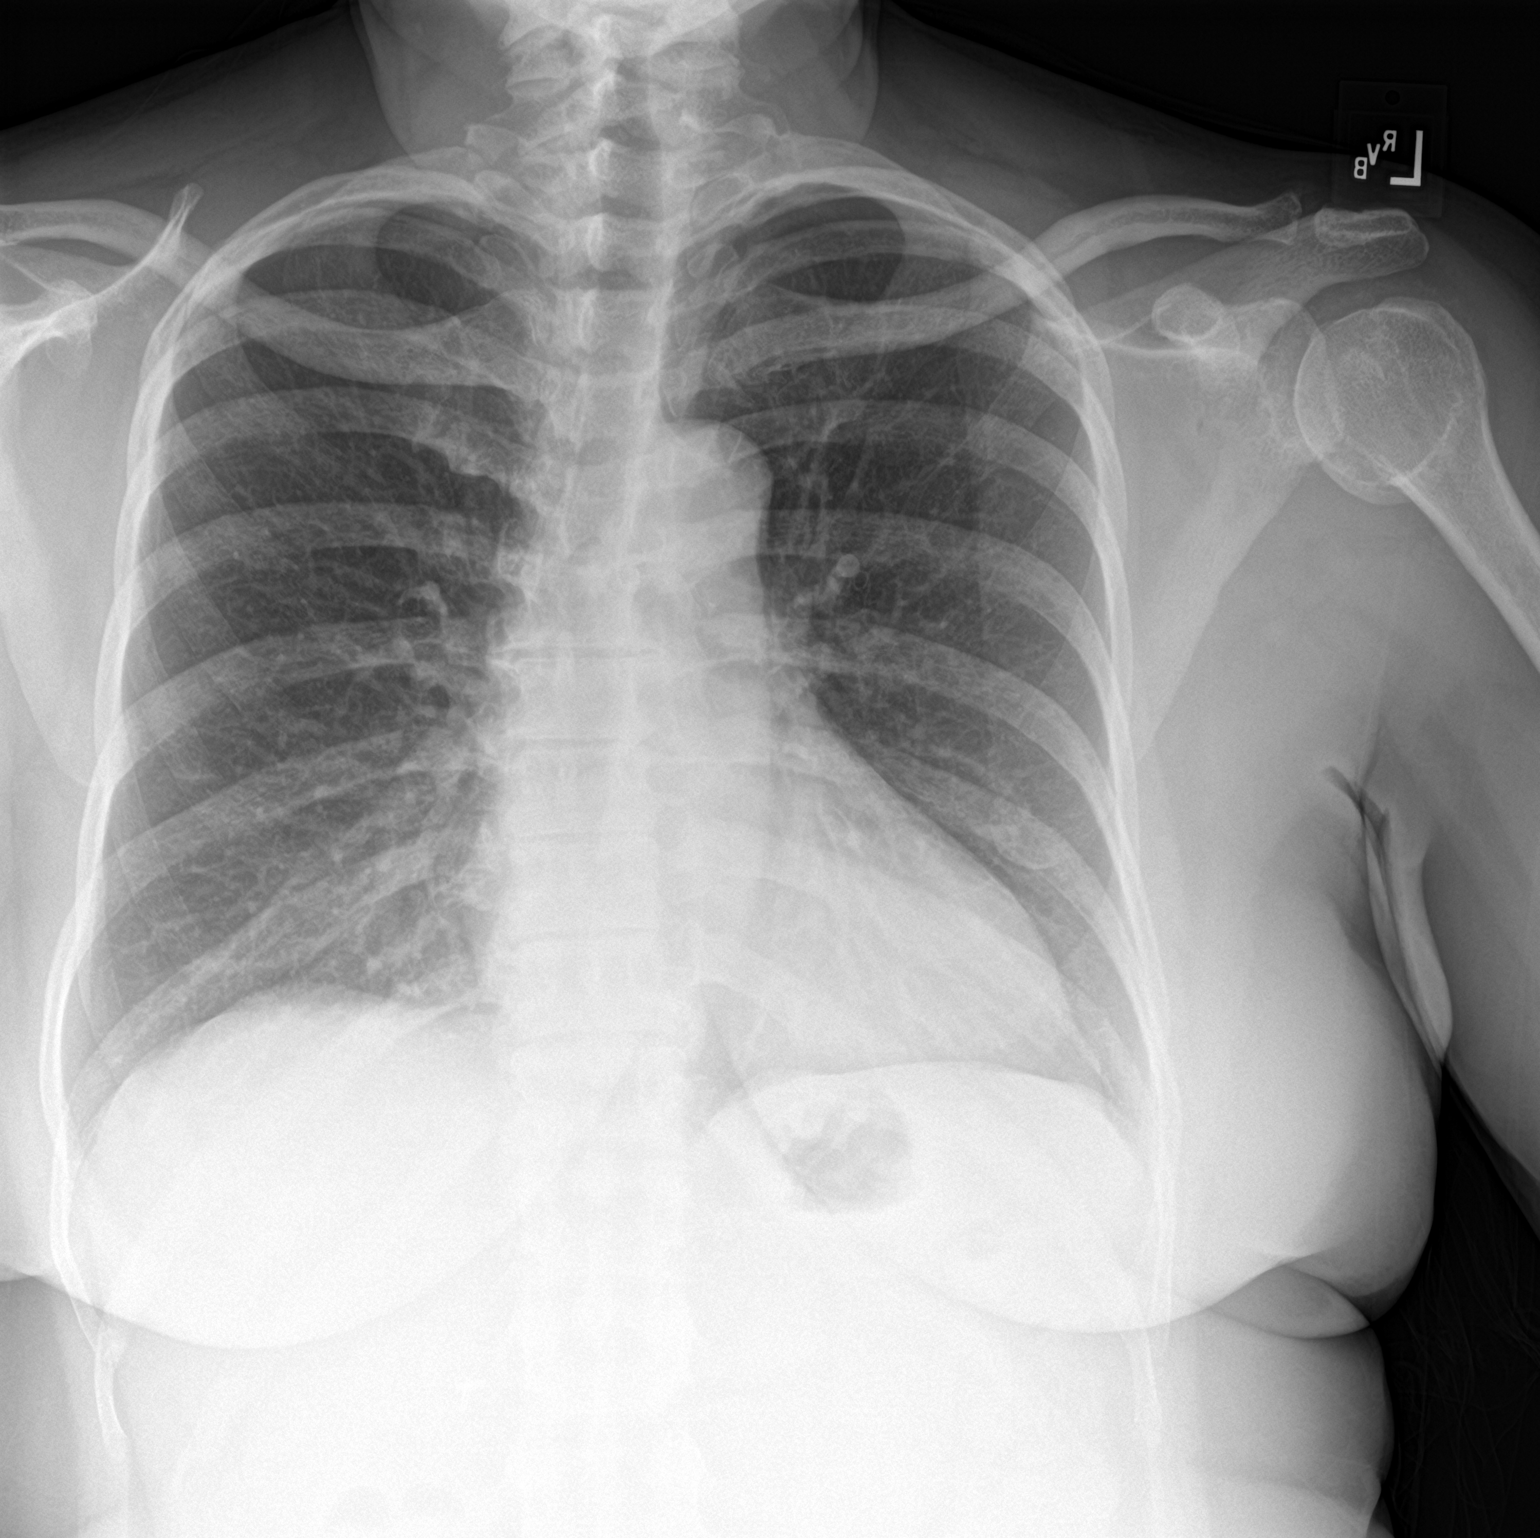

[chest lat]
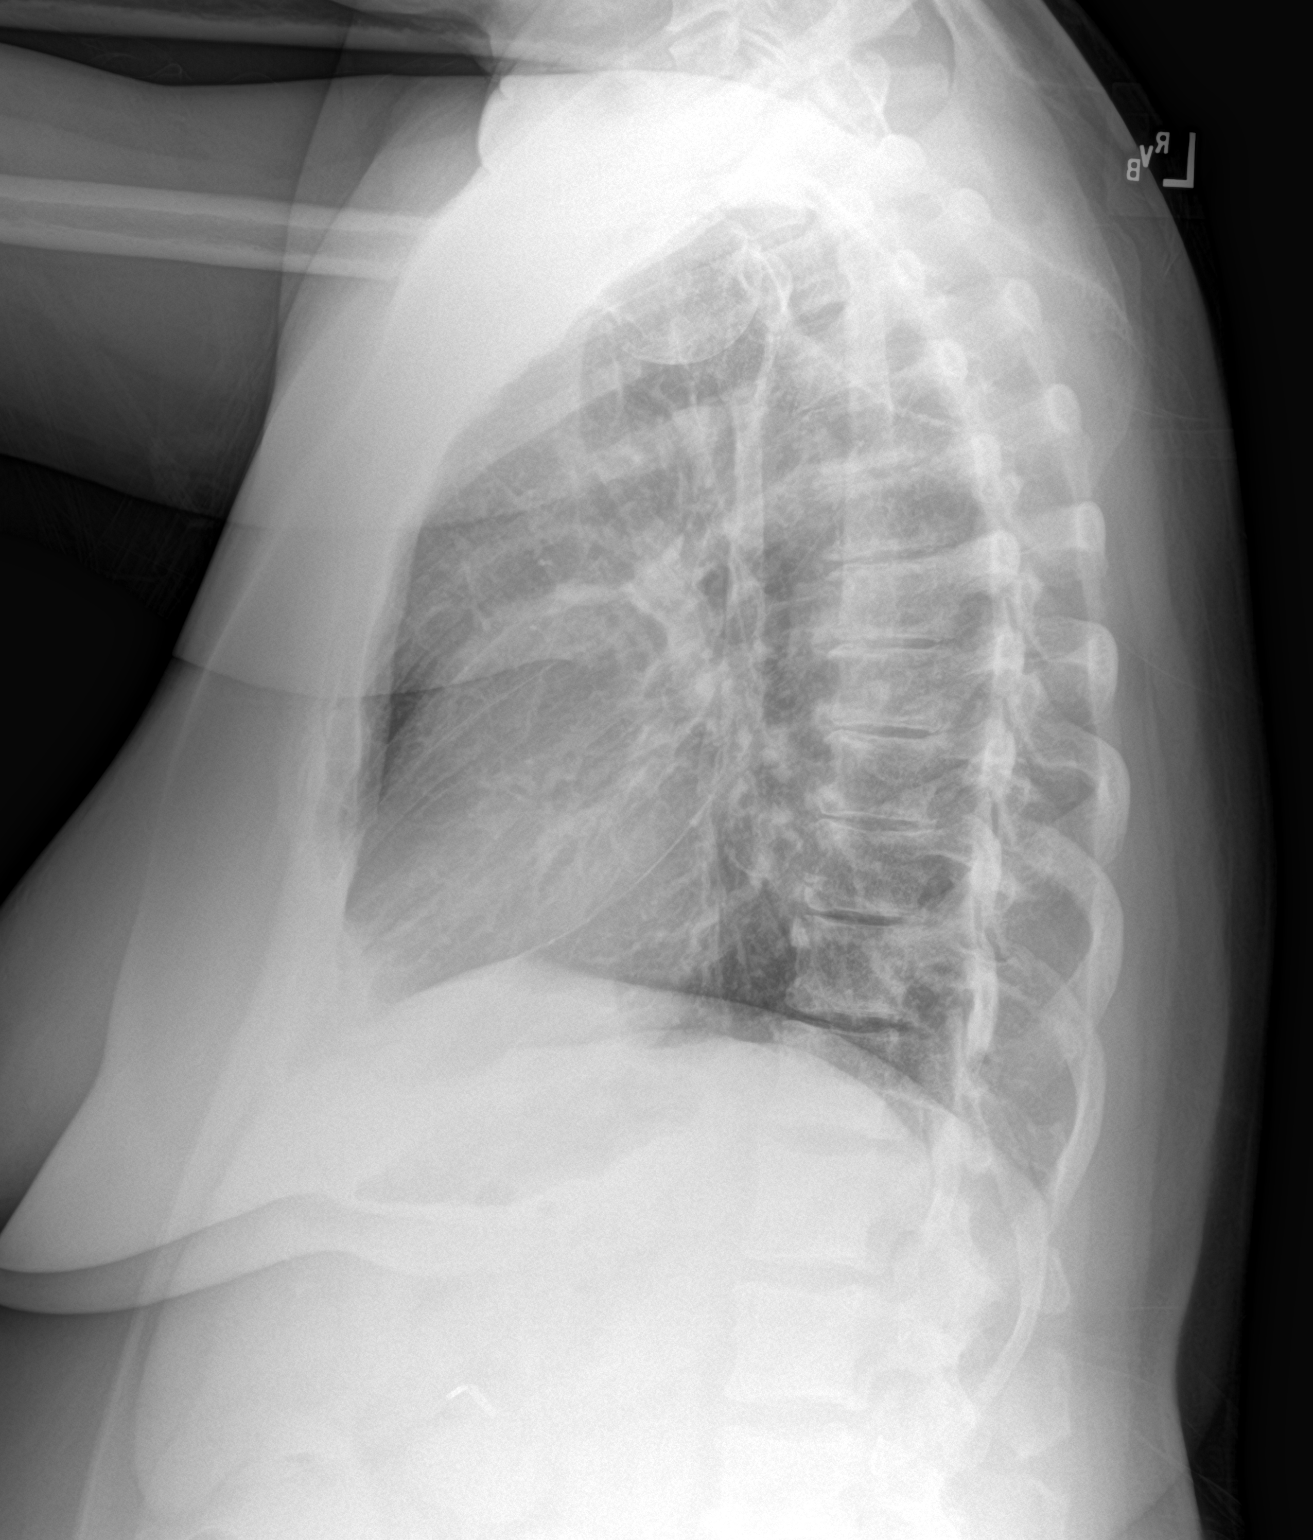

[2 of 2 positions shown; findings below may reference images not displayed]

FINDINGS: The heart size and mediastinal contours are within normal limits.
Both lungs are clear. The visualized skeletal structures are
unremarkable.
IMPRESSION: No active cardiopulmonary disease.

## 2022-08-12 IMAGING — XA DG C-ARM 1-60 MIN-NO REPORT
1 series · 2 of 2 positions shown · non-contrast
Comparison: 02/12/2020

CLINICAL DATA: Right hip replacement surgery

EXAM:
OPERATIVE RIGHT HIP (WITH PELVIS IF PERFORMED) 2 VIEWS
TECHNIQUE: Fluoroscopic spot image(s) were submitted for interpretation
post-operatively.

[Series 1: unknown protocol · 2 of 2 slices shown]
[im 1/2]
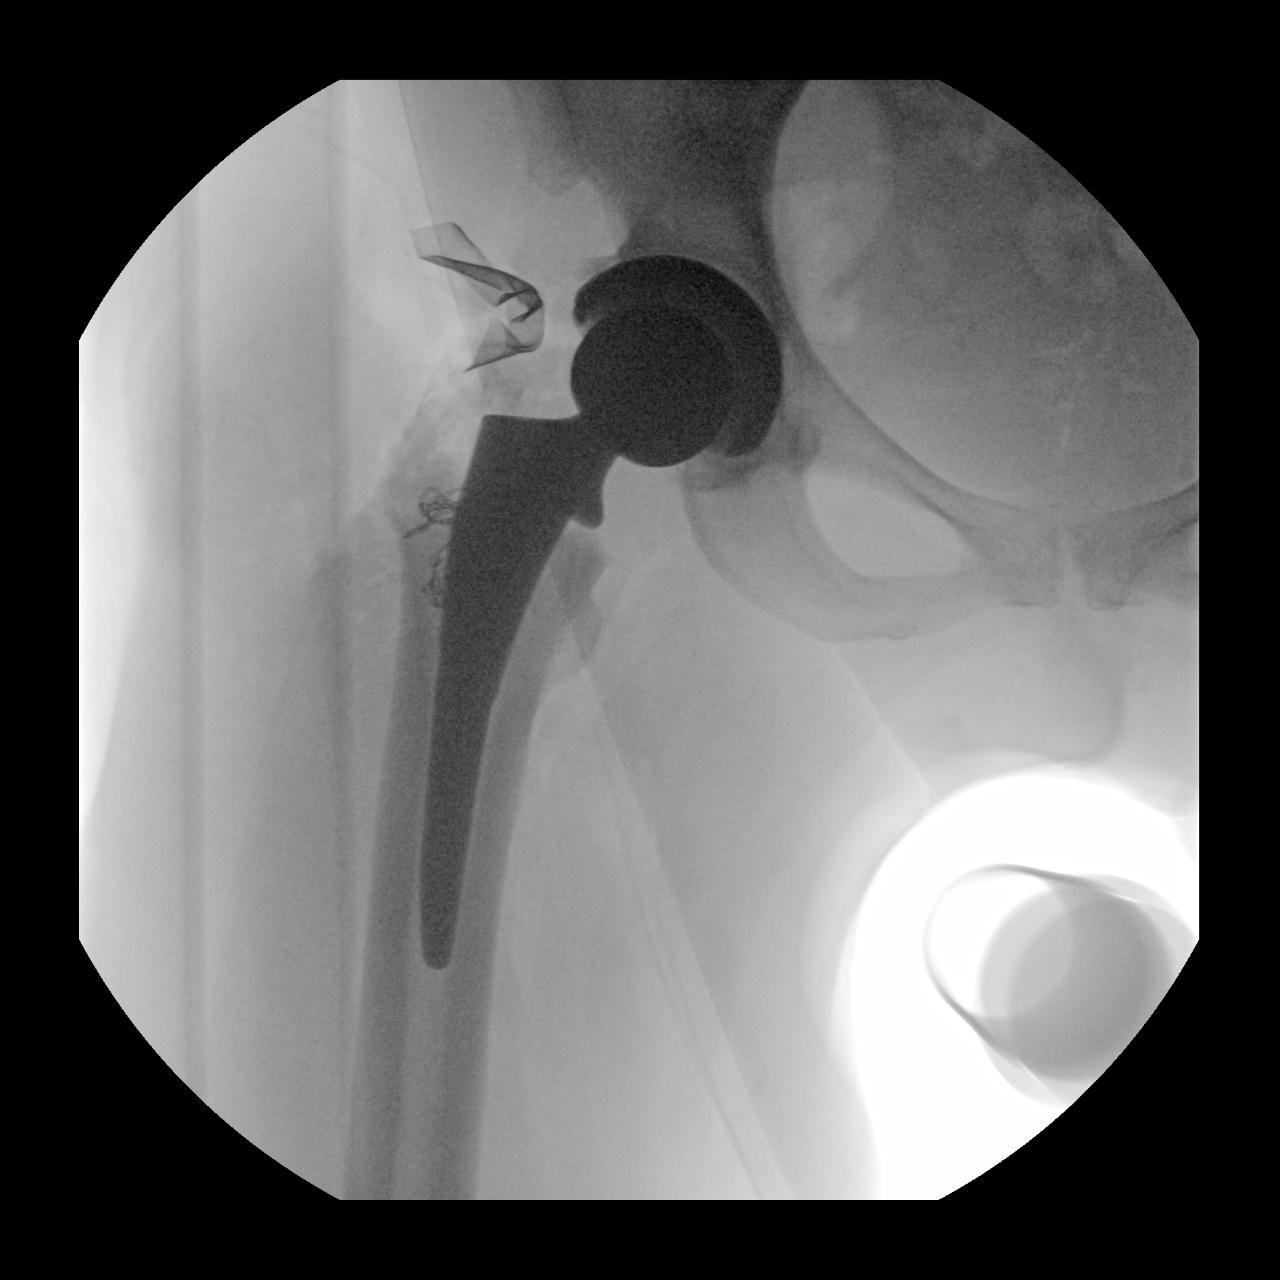
[im 2/2]
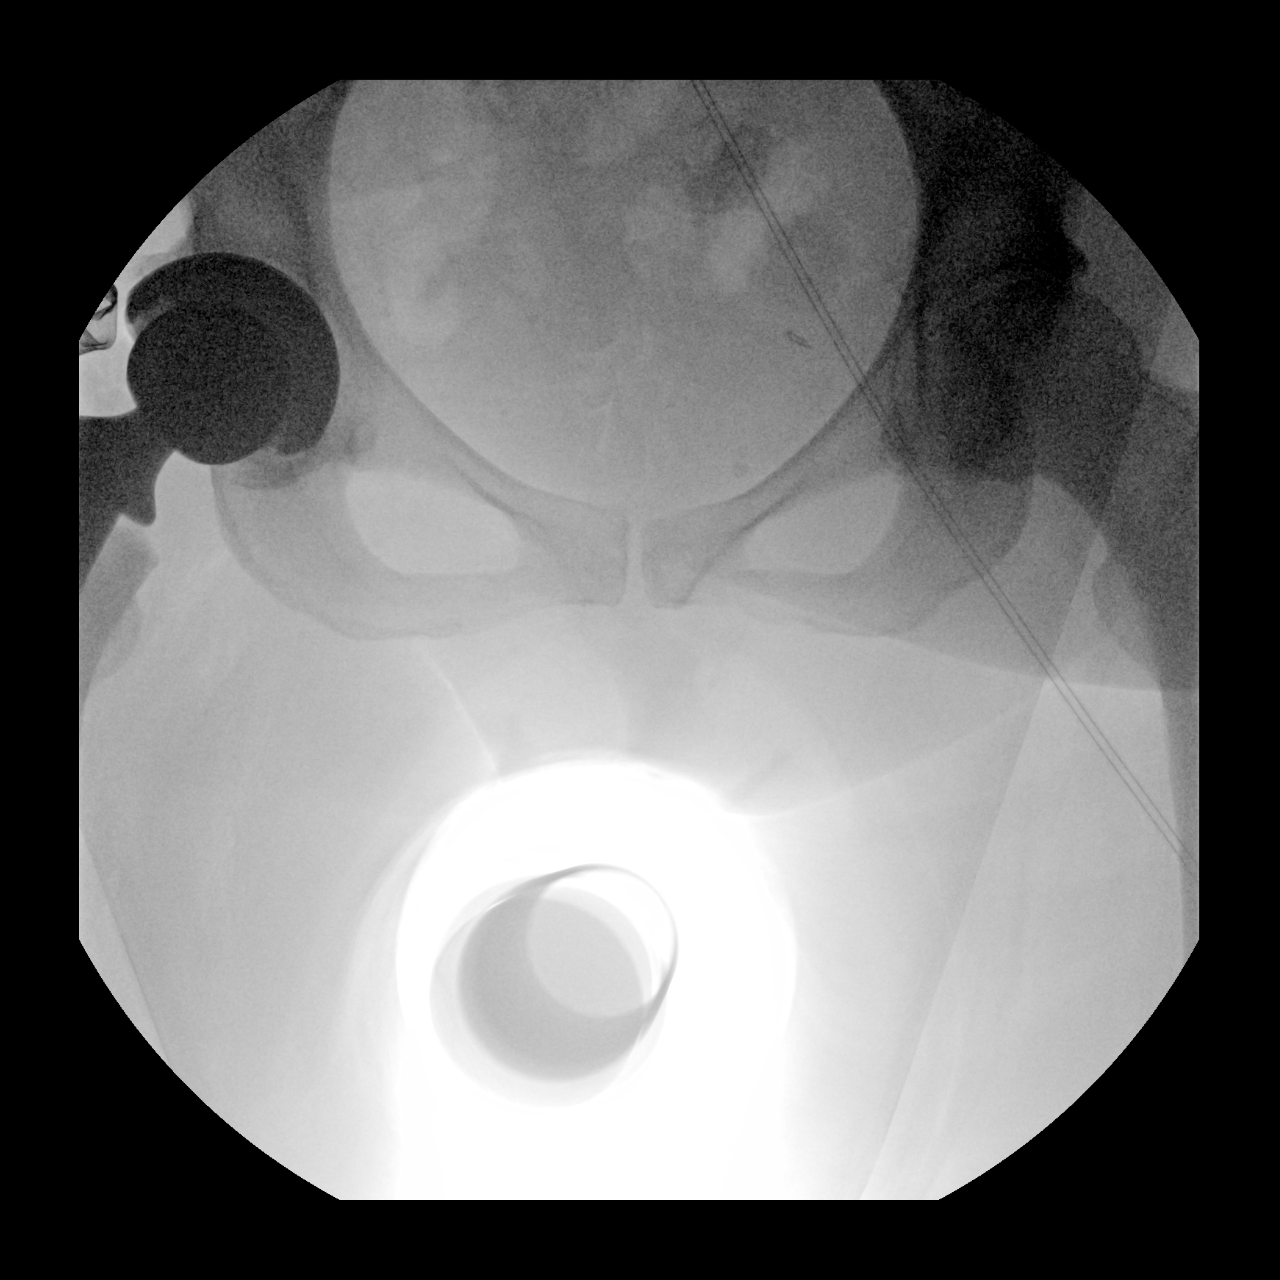

[2 of 2 positions shown; findings below may reference images not displayed]

FINDINGS: Interval right hip arthroplasty. Components project in expected
location. No fracture or dislocation.
IMPRESSION: Right hip arthroplasty without apparent complication.

## 2022-08-12 IMAGING — XA DG HIP (WITH PELVIS) OPERATIVE*R*
1 series · 2 of 2 positions shown · non-contrast
Comparison: 02/12/2020

CLINICAL DATA: Right hip replacement surgery

EXAM:
OPERATIVE RIGHT HIP (WITH PELVIS IF PERFORMED) 2 VIEWS
TECHNIQUE: Fluoroscopic spot image(s) were submitted for interpretation
post-operatively.

[Series 1: unknown protocol · 2 of 2 slices shown]
[im 1/2]
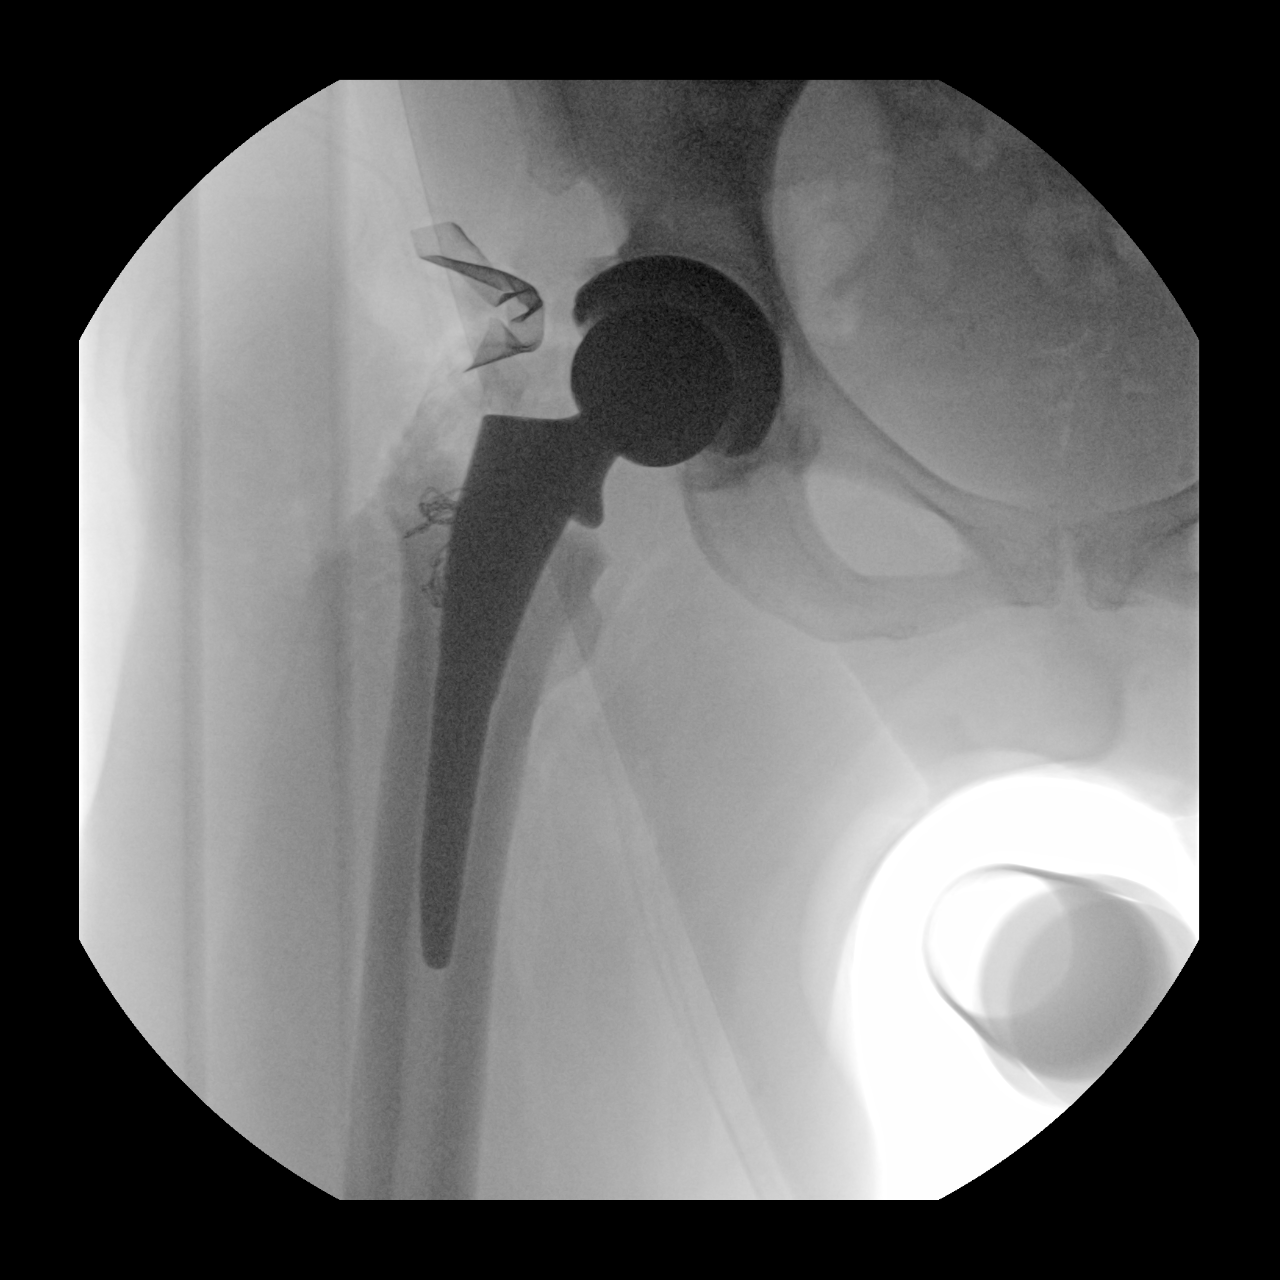
[im 2/2]
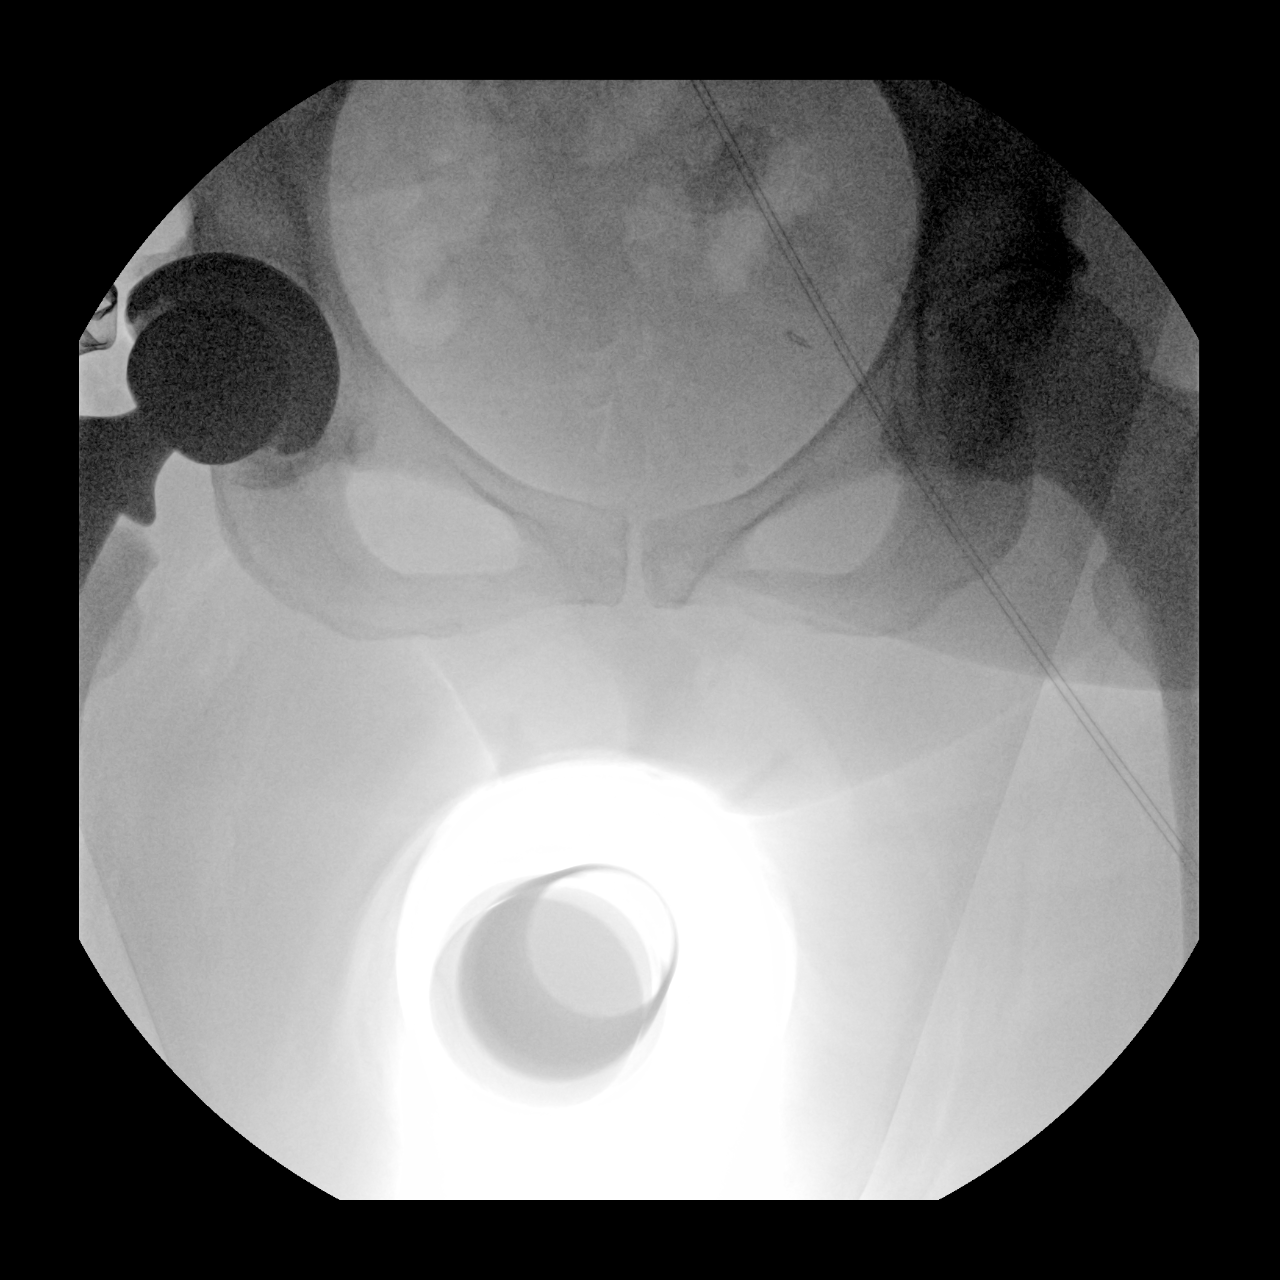

[2 of 2 positions shown; findings below may reference images not displayed]

FINDINGS: Interval right hip arthroplasty. Components project in expected
location. No fracture or dislocation.
IMPRESSION: Right hip arthroplasty without apparent complication.

## 2022-08-16 ENCOUNTER — Ambulatory Visit (INDEPENDENT_AMBULATORY_CARE_PROVIDER_SITE_OTHER): Payer: Medicare HMO

## 2022-08-16 ENCOUNTER — Other Ambulatory Visit: Payer: Self-pay | Admitting: Podiatry

## 2022-08-16 ENCOUNTER — Ambulatory Visit (INDEPENDENT_AMBULATORY_CARE_PROVIDER_SITE_OTHER): Payer: Medicare HMO | Admitting: Podiatry

## 2022-08-16 ENCOUNTER — Encounter: Payer: Self-pay | Admitting: Podiatry

## 2022-08-16 DIAGNOSIS — M79672 Pain in left foot: Secondary | ICD-10-CM

## 2022-08-16 DIAGNOSIS — M778 Other enthesopathies, not elsewhere classified: Secondary | ICD-10-CM

## 2022-08-16 DIAGNOSIS — M7752 Other enthesopathy of left foot: Secondary | ICD-10-CM

## 2022-08-16 MED ORDER — TRIAMCINOLONE ACETONIDE 10 MG/ML IJ SUSP
10.0000 mg | Freq: Once | INTRAMUSCULAR | Status: AC
  Administered 2022-08-16: 10 mg

## 2022-08-16 NOTE — Progress Notes (Signed)
   Subjective:   Patient ID: Deborah Casey, female   DOB: 67 y.o.   MRN: [id number]   HPI Patient presents with a lot of pain on top of the left foot states it has been sore and inflamed   ROS      Objective:  Physical Exam  Neurovascular status intact with inflammation of the midtarsal joint left fluid buildup of this joint surface     Assessment:  Acute tendinitis of the left midfoot     Plan:  H&P reviewed sterile prep injected the midfoot left 3 mg Kenalog 5 mg Xylocaine dorsal after explaining risk and patient will be seen back as needed  X-rays indicate that there is quite a bit of spurring to the midtarsal joint left with pain joint space narrowing this

## 2022-11-24 ENCOUNTER — Other Ambulatory Visit: Payer: Self-pay | Admitting: Adult Health

## 2022-11-24 DIAGNOSIS — R928 Other abnormal and inconclusive findings on diagnostic imaging of breast: Secondary | ICD-10-CM

## 2022-11-24 DIAGNOSIS — N63 Unspecified lump in unspecified breast: Secondary | ICD-10-CM

## 2022-12-04 ENCOUNTER — Ambulatory Visit (INDEPENDENT_AMBULATORY_CARE_PROVIDER_SITE_OTHER): Payer: Medicare HMO | Admitting: Podiatry

## 2022-12-04 ENCOUNTER — Encounter: Payer: Self-pay | Admitting: Podiatry

## 2022-12-04 DIAGNOSIS — M778 Other enthesopathies, not elsewhere classified: Secondary | ICD-10-CM

## 2022-12-04 MED ORDER — TRIAMCINOLONE ACETONIDE 10 MG/ML IJ SUSP
10.0000 mg | Freq: Once | INTRAMUSCULAR | Status: AC
  Administered 2022-12-04: 10 mg via INTRA_ARTICULAR

## 2022-12-05 NOTE — Progress Notes (Signed)
Subjective:   Patient ID: Deborah Casey, female   DOB: 67 y.o.   MRN: [id number]   HPI Patient states that started develop pain again but I did have a number of months relief   ROS      Objective:  Physical Exam  Neuro vascular status intact inflammation discomfort dorsal left foot around the midtarsal joint extensor complex     Assessment:  Extensor tendinitis left improved but still painful     Plan:  H&P reviewed the continuation of exercises and utilization of diclofenac gel and I discussed careful steroid injection patient wants this done I did sterile prep after explaining risk and injected 3 mg dexamethasone Kenalog 5 mg Xylocaine dorsal midtarsal joint extensor complex.  Sterile dressing applied

## 2022-12-25 ENCOUNTER — Ambulatory Visit
Admission: RE | Admit: 2022-12-25 | Discharge: 2022-12-25 | Disposition: A | Discharge status: Home / Self Care | Payer: Medicare HMO | Source: Ambulatory Visit | Attending: Adult Health | Admitting: Adult Health

## 2022-12-25 ENCOUNTER — Ambulatory Visit
Admission: RE | Admit: 2022-12-25 | Discharge: 2022-12-25 | Disposition: A | Discharge status: Home / Self Care | Payer: Medicare HMO | Source: Ambulatory Visit | Attending: Adult Health

## 2022-12-25 DIAGNOSIS — R928 Other abnormal and inconclusive findings on diagnostic imaging of breast: Secondary | ICD-10-CM

## 2022-12-25 DIAGNOSIS — N63 Unspecified lump in unspecified breast: Secondary | ICD-10-CM

## 2023-01-03 ENCOUNTER — Ambulatory Visit
Admission: RE | Admit: 2023-01-03 | Discharge: 2023-01-03 | Disposition: A | Discharge status: Home / Self Care | Payer: Medicare HMO | Source: Ambulatory Visit | Attending: Geriatric Medicine | Admitting: Geriatric Medicine

## 2023-01-03 DIAGNOSIS — Z13820 Encounter for screening for osteoporosis: Secondary | ICD-10-CM

## 2023-06-19 ENCOUNTER — Encounter: Payer: Self-pay | Admitting: Podiatry

## 2023-06-19 ENCOUNTER — Ambulatory Visit (INDEPENDENT_AMBULATORY_CARE_PROVIDER_SITE_OTHER): Admitting: Podiatry

## 2023-06-19 DIAGNOSIS — M778 Other enthesopathies, not elsewhere classified: Secondary | ICD-10-CM | POA: Diagnosis not present

## 2023-06-19 MED ORDER — TRIAMCINOLONE ACETONIDE 40 MG/ML IJ SUSP
20.0000 mg | Freq: Once | INTRAMUSCULAR | Status: AC
  Administered 2023-06-19: 20 mg

## 2023-06-20 NOTE — Progress Notes (Signed)
 She presents today as a patient of Dr. Merle Starcher for follow-up of extensor tendinitis states that she has been doing quite well but is still been bothering me on and off and is getting to the point where he really is cannot walk as well.  He gave me a shot a few times before and I take Tylenol  arthritis with Celebrex and it seems to help some.  Objective: Vital signs are stable alert oriented x 3.  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  Assessment: Capsulitis osteoarthritis neuritis.  Plan: Injected the dorsal aspect of the left foot 10 mg Kenalog  5 mg Marcaine  deep to the extensor tendons.  Will follow-up with her on an as-needed basis.

## 2024-02-19 ENCOUNTER — Other Ambulatory Visit: Payer: Self-pay | Admitting: Family

## 2024-02-19 DIAGNOSIS — Z1231 Encounter for screening mammogram for malignant neoplasm of breast: Secondary | ICD-10-CM

## 2024-03-13 ENCOUNTER — Ambulatory Visit
Admission: RE | Admit: 2024-03-13 | Discharge: 2024-03-13 | Disposition: A | Discharge status: Home / Self Care | Source: Ambulatory Visit | Attending: Family | Admitting: Family

## 2024-03-13 DIAGNOSIS — Z1231 Encounter for screening mammogram for malignant neoplasm of breast: Secondary | ICD-10-CM

## 2024-03-19 ENCOUNTER — Other Ambulatory Visit: Payer: Self-pay | Admitting: Family

## 2024-03-19 DIAGNOSIS — R928 Other abnormal and inconclusive findings on diagnostic imaging of breast: Secondary | ICD-10-CM

## 2024-03-27 ENCOUNTER — Encounter
